# Patient Record
Sex: Male | Born: 1935 | Race: Black or African American | Hispanic: No | Smoking: Never smoker
Health system: Southern US, Community
[De-identification: ages and names within clinical notes are randomized; demographics above are authoritative.]

## PROBLEM LIST (undated history)

## (undated) DIAGNOSIS — H409 Unspecified glaucoma: Secondary | ICD-10-CM

## (undated) DIAGNOSIS — D649 Anemia, unspecified: Secondary | ICD-10-CM

## (undated) DIAGNOSIS — D472 Monoclonal gammopathy: Secondary | ICD-10-CM

## (undated) DIAGNOSIS — I10 Essential (primary) hypertension: Secondary | ICD-10-CM

## (undated) HISTORY — DX: Monoclonal gammopathy: D47.2

## (undated) HISTORY — PX: OTHER SURGICAL HISTORY: SHX169

## (undated) HISTORY — DX: Essential (primary) hypertension: I10

## (undated) HISTORY — DX: Anemia, unspecified: D64.9

## (undated) HISTORY — DX: Unspecified glaucoma: H40.9

---

## 2006-06-16 ENCOUNTER — Ambulatory Visit: Payer: Self-pay | Admitting: Cardiology

## 2006-06-21 ENCOUNTER — Ambulatory Visit (HOSPITAL_COMMUNITY): Admission: RE | Admit: 2006-06-21 | Discharge: 2006-06-21 | Payer: Self-pay | Admitting: Cardiology

## 2012-06-08 LAB — PROTIME-INR: INR: 1.7 — AB (ref 0.9–1.1)

## 2016-01-21 ENCOUNTER — Other Ambulatory Visit (HOSPITAL_COMMUNITY): Payer: Self-pay | Admitting: Internal Medicine

## 2016-01-21 DIAGNOSIS — C9030 Solitary plasmacytoma not having achieved remission: Secondary | ICD-10-CM

## 2016-01-21 DIAGNOSIS — D509 Iron deficiency anemia, unspecified: Secondary | ICD-10-CM | POA: Insufficient documentation

## 2016-01-21 DIAGNOSIS — D472 Monoclonal gammopathy: Secondary | ICD-10-CM | POA: Insufficient documentation

## 2016-01-30 ENCOUNTER — Ambulatory Visit (HOSPITAL_COMMUNITY)
Admission: RE | Admit: 2016-01-30 | Discharge: 2016-01-30 | Disposition: A | Discharge status: Home / Self Care | Payer: Medicare FFS | Source: Ambulatory Visit | Attending: Internal Medicine | Admitting: Internal Medicine

## 2016-01-30 DIAGNOSIS — N281 Cyst of kidney, acquired: Secondary | ICD-10-CM | POA: Diagnosis not present

## 2016-01-30 DIAGNOSIS — C9030 Solitary plasmacytoma not having achieved remission: Secondary | ICD-10-CM | POA: Diagnosis not present

## 2016-01-30 DIAGNOSIS — K7689 Other specified diseases of liver: Secondary | ICD-10-CM | POA: Diagnosis not present

## 2016-01-30 DIAGNOSIS — I7789 Other specified disorders of arteries and arterioles: Secondary | ICD-10-CM | POA: Diagnosis not present

## 2016-01-30 DIAGNOSIS — I289 Disease of pulmonary vessels, unspecified: Secondary | ICD-10-CM | POA: Insufficient documentation

## 2016-01-30 LAB — GLUCOSE, CAPILLARY: Glucose-Capillary: 77 mg/dL (ref 65–99)

## 2016-01-30 MED ORDER — FLUDEOXYGLUCOSE F - 18 (FDG) INJECTION
9.8000 | Freq: Once | INTRAVENOUS | Status: AC | PRN
  Administered 2016-01-30: 9.8 via INTRAVENOUS

## 2016-02-05 DIAGNOSIS — C9000 Multiple myeloma not having achieved remission: Secondary | ICD-10-CM | POA: Insufficient documentation

## 2016-02-05 DIAGNOSIS — D638 Anemia in other chronic diseases classified elsewhere: Secondary | ICD-10-CM | POA: Insufficient documentation

## 2016-02-05 DIAGNOSIS — Z7901 Long term (current) use of anticoagulants: Secondary | ICD-10-CM | POA: Insufficient documentation

## 2016-02-05 DIAGNOSIS — Z86718 Personal history of other venous thrombosis and embolism: Secondary | ICD-10-CM | POA: Insufficient documentation

## 2016-03-10 DIAGNOSIS — D510 Vitamin B12 deficiency anemia due to intrinsic factor deficiency: Secondary | ICD-10-CM | POA: Insufficient documentation

## 2016-04-08 ENCOUNTER — Other Ambulatory Visit (HOSPITAL_COMMUNITY): Payer: Self-pay | Admitting: Oncology

## 2016-06-29 ENCOUNTER — Encounter (HOSPITAL_COMMUNITY): Payer: Self-pay | Admitting: Adult Health

## 2016-06-29 ENCOUNTER — Encounter (HOSPITAL_COMMUNITY): Payer: Medicare FFS

## 2016-06-29 ENCOUNTER — Ambulatory Visit (HOSPITAL_COMMUNITY): Payer: Medicare FFS | Admitting: Hematology & Oncology

## 2016-06-29 ENCOUNTER — Encounter (HOSPITAL_COMMUNITY): Payer: Medicare FFS | Attending: Adult Health | Admitting: Adult Health

## 2016-06-29 VITALS — BP 153/70 | HR 58 | Temp 97.7°F | Resp 16 | Ht 70.25 in | Wt 193.8 lb

## 2016-06-29 DIAGNOSIS — D7589 Other specified diseases of blood and blood-forming organs: Secondary | ICD-10-CM | POA: Diagnosis not present

## 2016-06-29 DIAGNOSIS — H409 Unspecified glaucoma: Secondary | ICD-10-CM | POA: Diagnosis not present

## 2016-06-29 DIAGNOSIS — I82402 Acute embolism and thrombosis of unspecified deep veins of left lower extremity: Secondary | ICD-10-CM

## 2016-06-29 DIAGNOSIS — Z86718 Personal history of other venous thrombosis and embolism: Secondary | ICD-10-CM | POA: Insufficient documentation

## 2016-06-29 DIAGNOSIS — C9000 Multiple myeloma not having achieved remission: Secondary | ICD-10-CM | POA: Insufficient documentation

## 2016-06-29 DIAGNOSIS — D509 Iron deficiency anemia, unspecified: Secondary | ICD-10-CM | POA: Diagnosis not present

## 2016-06-29 DIAGNOSIS — D472 Monoclonal gammopathy: Secondary | ICD-10-CM

## 2016-06-29 DIAGNOSIS — Z7901 Long term (current) use of anticoagulants: Secondary | ICD-10-CM | POA: Diagnosis not present

## 2016-06-29 DIAGNOSIS — E538 Deficiency of other specified B group vitamins: Secondary | ICD-10-CM | POA: Diagnosis not present

## 2016-06-29 DIAGNOSIS — I10 Essential (primary) hypertension: Secondary | ICD-10-CM | POA: Insufficient documentation

## 2016-06-29 LAB — COMPREHENSIVE METABOLIC PANEL
ALBUMIN: 3.7 g/dL (ref 3.5–5.0)
ALK PHOS: 47 U/L (ref 38–126)
ALT: 12 U/L — AB (ref 17–63)
AST: 28 U/L (ref 15–41)
Anion gap: 5 (ref 5–15)
BUN: 19 mg/dL (ref 6–20)
CALCIUM: 9.1 mg/dL (ref 8.9–10.3)
CHLORIDE: 103 mmol/L (ref 101–111)
CO2: 26 mmol/L (ref 22–32)
CREATININE: 1.16 mg/dL (ref 0.61–1.24)
GFR calc Af Amer: >60 mL/min (ref >60)
GFR calc non Af Amer: 56 mL/min — ABNORMAL LOW (ref >60)
GLUCOSE: 85 mg/dL (ref 65–99)
Potassium: 3.7 mmol/L (ref 3.5–5.1)
SODIUM: 134 mmol/L — AB (ref 135–145)
Total Bilirubin: 0.4 mg/dL (ref 0.3–1.2)
Total Protein: 9.9 g/dL — ABNORMAL HIGH (ref 6.5–8.1)

## 2016-06-29 LAB — VITAMIN B12: Vitamin B-12: 346 pg/mL (ref 180–914)

## 2016-06-29 LAB — CBC WITH DIFFERENTIAL/PLATELET
BASOS ABS: 0 10*3/uL (ref 0.0–0.1)
Basophils Relative: 1 %
EOS ABS: 0.1 10*3/uL (ref 0.0–0.7)
Eosinophils Relative: 2 %
HCT: 38.3 % — ABNORMAL LOW (ref 39.0–52.0)
HEMOGLOBIN: 12.5 g/dL — AB (ref 13.0–17.0)
LYMPHS ABS: 1.7 10*3/uL (ref 0.7–4.0)
Lymphocytes Relative: 41 %
MCH: 31.9 pg (ref 26.0–34.0)
MCHC: 32.6 g/dL (ref 30.0–36.0)
MCV: 97.7 fL (ref 78.0–100.0)
Monocytes Absolute: 0.5 10*3/uL (ref 0.1–1.0)
Monocytes Relative: 12 %
NEUTROS PCT: 44 %
Neutro Abs: 1.9 10*3/uL (ref 1.7–7.7)
Platelets: 158 10*3/uL (ref 150–400)
RBC: 3.92 MIL/uL — AB (ref 4.22–5.81)
RDW: 15.2 % (ref 11.5–15.5)
WBC: 4.2 10*3/uL (ref 4.0–10.5)

## 2016-06-29 LAB — LACTATE DEHYDROGENASE: LDH: 195 U/L — ABNORMAL HIGH (ref 98–192)

## 2016-06-29 LAB — FOLATE: Folate: 21.7 ng/mL (ref >5.9)

## 2016-06-29 NOTE — Patient Instructions (Signed)
[place] at [facility] Discharge Instructions  RECOMMENDATIONS MADE BY THE CONSULTANT AND ANY TEST RESULTS WILL BE SENT TO YOUR REFERRING PHYSICIAN.  You saw Mike Craze, NP, today.  Thank you for choosing [place] at [facility] to provide your oncology and hematology care.  To afford each patient quality time with our provider, please arrive at least 15 minutes before your scheduled appointment time.   Beginning January 23rd 2017 lab work for the [facility] will be done in the  Main lab at [facility] on 1st floor. If you have a lab appointment with the [facility] please come in thru the  Main Entrance and check in at the main information desk  You need to re-schedule your appointment should you arrive 10 or more minutes late.  We strive to give you quality time with our providers, and arriving late affects you and other patients whose appointments are after yours.  Also, if you no show three or more times for appointments you may be dismissed from the clinic at the providers discretion.     Again, thank you for choosing [facility].  Our hope is that these requests will decrease the amount of time that you wait before being seen by our physicians.       _____________________________________________________________  Should you have questions after your visit to [facility], please contact our office at [phone] between the hours of 8:30 a.m. and 4:30 p.m.  Voicemails left after 4:30 p.m. will not be returned until the following business day.  For prescription refill requests, have your pharmacy contact our office.         Resources For Cancer Patients and their Caregivers ? American Cancer Society: Can assist with transportation, wigs, general needs, runs Look Good Feel Better.        949-394-8860 ? Cancer Care: Provides financial assistance, online support groups, medication/co-pay  assistance.  1-800-813-HOPE 386-029-6129) ? [facility] Assists [place] cancer patients and their families through emotional , educational and financial support.  (313)774-1615 ? [place] DSS Where to apply for food stamps, Medicaid and utility assistance. 405 413 0049 ? [facility]: Transportation to medical appointments. 478-550-5688 ? Social Security Administration: May apply for disability if have a Stage IV cancer. 780 206 4003 3478033967 ? [employer], Disability and Transit Services: Assists with nutrition, care and transit needs. [place] Support Programs: @10RELATIVEDAYS @ > Cancer Support Group  2nd Tuesday of the month 1pm-2pm, Journey Room  > Creative Journey  3rd Tuesday of the month 1130am-1pm, Journey Room  > Look Good Feel Better  1st Wednesday of the month 10am-12 noon, Journey Room (Call [facility] to register [phone])

## 2016-07-05 LAB — MULTIPLE MYELOMA PANEL, SERUM
ALBUMIN/GLOB SERPL: 0.7 (ref 0.7–1.7)
Albumin SerPl Elph-Mcnc: 4 g/dL (ref 2.9–4.4)
Alpha 1: 0.2 g/dL (ref 0.0–0.4)
Alpha2 Glob SerPl Elph-Mcnc: 0.6 g/dL (ref 0.4–1.0)
B-Globulin SerPl Elph-Mcnc: 1 g/dL (ref 0.7–1.3)
Gamma Glob SerPl Elph-Mcnc: 4.2 g/dL — ABNORMAL HIGH (ref 0.4–1.8)
Globulin, Total: 6 g/dL — ABNORMAL HIGH (ref 2.2–3.9)
IGG (IMMUNOGLOBIN G), SERUM: 4872 mg/dL — AB (ref 700–1600)
IgA: <5 mg/dL — ABNORMAL LOW (ref 61–437)
IgM, Serum: 17 mg/dL (ref 15–143)
M PROTEIN SERPL ELPH-MCNC: 3.8 g/dL — AB
TOTAL PROTEIN ELP: 10 g/dL — AB (ref 6.0–8.5)

## 2016-07-06 NOTE — [place] Notes (Signed)
[name] [address] [place] [address]   CLINIC:  Medical Oncology  REASON FOR CONSULTATION:  Smoldering multiple myeloma Iron-deficiency anemia   REFERRAL FROM:  [facility]  Dr. Audree Camel   PRIMARY CARE PROVIDER: Dr. Stoney Bang [phone]   BRIEF ONCOLOGY HISTORY:  Multiple Myeloma-Patient was referred to Dr. Jacquiline Doe, further work-up suggested malignancy as possible etiology. In 12/2015-patient underwent bone marrow biopsy showing 13% myeloma cells, hypercellular bone marrow 40%, FISH IGH/14q+, and gain of Ch9 & CCNDI/11q; Amyloid stains (-). No bone pain, muscle weakness.  No treatment recommended; has been under surveillance since that time.  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   Left LE DVT-Diagnosed about 7 years ago; on anti-coagulation with Coumadin 5 mg daily; managed by PCP.    INTERVAL HISTORY:  Dale Suarez presents to the [facility] to establish care after being referred by Dr. Jacquiline Doe at [facility]  He tells me that he was referred to see Dr. Jacquiline Doe back in 12/2015, who recommended he undergo bone marrow biopsy on suspicion of plasma cell disease.  He was found to have multiple myeloma, but no active treatment was recommended at that time "because it wasn't too bad that I needed any treatment."  He subsequently underwent PET scan evaluation "and he told me that everything looked good on that scan."    He denies any bone pain or weakness.  His energy levels are good; he remains very active by walking about 0.5 miles per day.  He lives alone and is able to care for himself independently.    About 7 years ago, he was diagnosed with a DVT in his left leg.  He is on daily Coumadin for this and it is well-managed by his PCP.  He tells me that he will occasionally have hematuria "and  that's when I know my blood is too thin so I call my doctor."  He has had no episodes of hematuria in quite some time.    His weight has been stable.  His appetite is good. He has some constipation from time-to-time, but "I just take me a few extra prunes and it gets better."  He denies any blood in his stools or dark, tarry stools.  Overall, he feels great.    He wanted me to make note that his only daughter, Dale Suarez, is his Press photographer and is a Architect and works as the Personal assistant on the mother/baby unit at [employer]: Review of Systems  Constitutional: Negative.   HENT: Negative.   Eyes: Negative.   Respiratory: Negative.   Cardiovascular: Negative.  Negative for leg swelling.  Gastrointestinal: Positive for constipation. Negative for blood in stool and melena.  Genitourinary: Negative.  Negative for hematuria.  Musculoskeletal: Negative.  Negative for back pain and joint pain.  Skin: Negative.   Neurological: Negative.   Endo/Heme/Allergies: Bruises/bleeds easily.       Easy bruising due to Coumadin   Psychiatric/Behavioral: Negative.     PAST MEDICAL & SURGICAL HISTORY:  Past Medical History:  Diagnosis Date  . Anemia   . Glaucoma   . Hypertension    History reviewed. No pertinent surgical history.   SOCIAL HISTORY:  [name] is widowed and lives alone in [place], [place].  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 He is now retired.  He has one daughter, Dale Suarez, who is his Press photographer and is a Architect and works as the Personal assistant on the mother/baby unit at [employer]. He has 4 grandsons, ranging in ages from 21-35.  He enjoys watching TV,  particularly old westerns.  He also enjoys walking, which he does every day for about 0.5 mile/day.    CURRENT MEDICATIONS:  No current outpatient prescriptions on file prior to visit.   No current facility-administered medications on file prior to visit.      ALLERGIES: No Known Allergies   PERFORMANCE STATUS: 90   PHYSICAL EXAM:  Vitals:   06/29/16 1446  BP: (!) 153/70  Pulse: (!) 58  Resp: 16  Temp: 97.7 F (36.5 C)   Filed Weights   06/29/16 1446  Weight: 193 lb 12.8 oz (87.9 kg)    General: Male in no acute distress.  Unaccompanied today.    HEENT: Head is normocephalic.  Pupils equal and reactive to light. Conjunctivae clear without exudate.  Sclerae anicteric. Oral mucosa is pink and moist without lesions. Oropharynx is pink and moist without lesions. Lymph: No cervical, supraclavicular, infraclavicular, or axillary lymphadenopathy noted on palpation.   Cardiovascular: Normal rate and rhythm Respiratory: Clear to auscultation bilaterally. Chest expansion symmetric without accessory muscle use. Breathing non-labored.   Back: No focal tenderness to spine on percussion.  Small, round mobile cyst to right upper back/scapula region (benign); non-tender; no erythema or pain.    GU: Deferred.   GI: Soft, non-tender abdomen. Normoactive bowel sounds. No hepatosplenomegaly. Neuro: No focal deficits. Steady gait.   Psych: Normal mood and affect for situation. Extremities: No edema.   Skin: Warm and dry.    LABORATORY DATA: Recent labs from PCP's office dated 06/03/16    CBC    Component Value Date/Time   WBC 4.2 06/29/2016 1619   RBC 3.92 (L) 06/29/2016 1619   HGB 12.5 (L) 06/29/2016 1619   HCT 38.3 (L) 06/29/2016 1619   PLT 158 06/29/2016 1619   MCV 97.7 06/29/2016 1619   MCH 31.9 06/29/2016 1619   MCHC 32.6 06/29/2016 1619   RDW 15.2 06/29/2016 1619   LYMPHSABS 1.7 06/29/2016 1619   MONOABS 0.5 06/29/2016 1619   EOSABS 0.1 06/29/2016 1619   BASOSABS 0.0  06/29/2016 1619   CMP Latest Ref Rng & Units 06/29/2016  Glucose 65 - 99 mg/dL 85  BUN 6 - 20 mg/dL 19  Creatinine 0.61 - 1.24 mg/dL 1.16  Sodium 135 - 145 mmol/L 134(L)  Potassium 3.5 - 5.1 mmol/L 3.7  Chloride 101 - 111 mmol/L 103  CO2 22 - 32 mmol/L 26  Calcium 8.9 - 10.3 mg/dL 9.1  Total Protein 6.5 - 8.1 g/dL 9.9(H)  Total Bilirubin 0.3 - 1.2 mg/dL 0.4  Alkaline Phos 38 - 126 U/L 47  AST 15 - 41 U/L 28  ALT 17 - 63 U/L 12(L)   Results for JOSHUAL, TERRIO (MRN [id number])   Ref. Range 06/29/2016 16:19  LDH Latest Ref Range: 98 - 192 U/L 195 (H)   Results for GURFATEH, MCCLAIN (MRN [id number])   Ref. Range 06/29/2016 16:19  Folate Latest Ref Range: >5.9 ng/mL 21.7  Vitamin B12 Latest Ref Range: 180 - 914  pg/mL 346     *Iron studies pending  DIAGNOSTIC IMAGING:  NM PET Image Initial (PI) Skull Base To Thigh (Accession [id number]) (Order [id number])   Show images for NM PET Image Initial (PI) Skull Base To Thigh  Study Result   CLINICAL DATA:  Initial treatment strategy for plasmacytoma.  EXAM: NUCLEAR MEDICINE PET SKULL BASE TO THIGH  TECHNIQUE: 9.8 mCi F-18 FDG was injected intravenously. Full-ring PET imaging was performed from the skull base to thigh after the radiotracer. CT data was obtained and used for attenuation correction and anatomic localization.  FASTING BLOOD GLUCOSE:  Value: 77 mg/dl  COMPARISON:  Skeletal survey from 12/25/2015.  FINDINGS: NECK  No areas of abnormal hypermetabolism.  CHEST  No areas of abnormal hypermetabolism.  ABDOMEN/PELVIS  No abnormal nodal hypermetabolism within the abdomen or pelvis. Left adrenal hypermetabolism is without well-defined CT correlate. Measures on the order of a S.U.V. max of 4.1.  SKELETON  No abnormal marrow activity.  CT IMAGES PERFORMED FOR ATTENUATION CORRECTION  No cervical adenopathy. Aortic and branch vessel atherosclerosis. 3 cm right pulmonary artery. Well-circumscribed  low-density hepatic lesions which are likely cysts. An interpolar are left renal 3.4 cm lesion is fluid density and also likely a cyst. Mild prostatomegaly. Right larger than left hydroceles.  IMPRESSION: 1. No evidence of hypermetabolic osseous or soft tissue myeloma. 2. Left adrenal hypermetabolism is favored to be physiologic. 3. Hepatic and left renal cysts. 4. Enlarged right pulmonary artery, likely related to history of large volume right-sided pulmonary embolism (see 06/16/06 chest CT).   Electronically Signed   By: Abigail Miyamoto M.D.   On: 01/30/2016 14:41     PET scan (outside records) showed:  1. Left adrenal hypermetabolism without well-defined CT correlate; felt to be physiologic. 2. No evidence of bone metastatic process.      ASSESSMENT & PLAN:  Mr. Strohm is a pleasant 82 y.o. male with recent diagnosis of plasma cell dyscrasia, with no prior treatment and currently on observation alone; diagnosed in 12/2015 at [facility] in [place], [place].  Patient presents to [facility] today to establish care and for continued follow-up.   1. Plasma cell dyscrasia/Smoldering multiple myeloma: Mr. Kobus is clinically stable without any side effects of the multiple myeloma. He denies any bone pain.  Basic labs were collected today including CBC, CMET and LDH. He is not hypercalcemic; his creatinine is normal.  His PET scan completed at outside facility is reassuring for no evidence of bony metastases.  We will also collect multiple myeloma panel to further evaluate his disease.  He will continue to follow-up with Dr. Whitney Muse every 3 months per surveillance guidelines.    2. Iron-deficiency anemia: Mr. Branch had 2 IV iron infusions (Injectafer) over 2 weeks back in 01/2016.  There are no records available for review of iron studies either before or after his IV iron. We will collect iron studies today to further evaluate his anemia.  He continues to  decline GI work-up today, citing "I don't want my insurance company to have to pay for any extra tests."    3. History of DVT, on anticoagulation: Continue Coumadin and follow-up as recommended by PCP.     Dispo:  -Return to cancer center to see Dr. Whitney Muse in 3 months with labs preceding that visit.   -This patient was seen & evaluated by Dr. Ancil Linsey who agrees with the above plan of care.   A total of 60 minutes was spent  in face-to-face care of this patient, with greater than 50% of that time spent in counseling and care coordination.    Mike Craze, NP [place] [phone]    Patient was seen and examined with Mike Craze, NP. This is a shared visit. I agree with her HPI. PE is documented below:   PHYSICAL EXAM:  Vitals:   06/29/16 1446  BP: (!) 153/70  Pulse: (!) 58  Resp: 16  Temp: 97.7 F (36.5 C)   Filed Weights   06/29/16 1446  Weight: 193 lb 12.8 oz (87.9 kg)    General: Male in no acute distress.  Unaccompanied today.    HEENT: Head is normocephalic.  Pupils equal and reactive to light. Conjunctivae clear without exudate.  Sclerae anicteric. Oral mucosa is pink and moist without lesions. Oropharynx is pink and moist without lesions. Lymph: No cervical, supraclavicular, infraclavicular, or axillary lymphadenopathy noted on palpation.   Cardiovascular: Normal rate and rhythm S1/S2 audible and regular Respiratory: Clear to auscultation bilaterally. No wheezing or rhonchi Back: No focal tenderness to spine on percussion.  Small, round mobile cyst to right upper back/scapula region (benign); non-tender; no erythema or pain.    GU: Deferred.   GI: Soft, non-tender abdomen. Normoactive bowel sounds. No hepatosplenomegaly. Neuro: No focal deficits. Steady gait.   Psych: Normal mood and affect for situation. Extremities: No edema.   Skin: Warm and dry.   ASSESSMENT/PLAN: Smoldering Myeloma Iron deficiency Anemia Chronic  anticoagulation Hemoccult positive stool Iron deficiency History LLE DVT  Labs are reviewed and stable, PET/CT was performed at [facility] in April and showed no evidence of lytic bone disease or soft tissue involvement. Would continue with coumadin as it is controversial whether patients with smoldering myeloma/MGUS are more prone to DVT. In addition will continue to follow labs in accordance with guidelines. Will follow iron levels as well. Given hemoccult positive stool, ongoing anticoagulation and iron deficiency will also continue to address GI evaluation moving forward.  RTC in 3 months. Sooner if needed. Donald Pore MD

## 2016-07-19 ENCOUNTER — Encounter (HOSPITAL_COMMUNITY): Payer: Self-pay | Admitting: Adult Health

## 2016-09-30 ENCOUNTER — Encounter (HOSPITAL_COMMUNITY): Payer: Medicare FFS

## 2016-09-30 ENCOUNTER — Encounter (HOSPITAL_COMMUNITY): Payer: Medicare FFS | Attending: Adult Health | Admitting: Hematology & Oncology

## 2016-09-30 ENCOUNTER — Encounter (HOSPITAL_COMMUNITY): Payer: Self-pay | Admitting: Hematology & Oncology

## 2016-09-30 VITALS — BP 149/58 | HR 64 | Temp 99.0°F | Resp 16 | Wt 196.6 lb

## 2016-09-30 DIAGNOSIS — I10 Essential (primary) hypertension: Secondary | ICD-10-CM | POA: Insufficient documentation

## 2016-09-30 DIAGNOSIS — E538 Deficiency of other specified B group vitamins: Secondary | ICD-10-CM

## 2016-09-30 DIAGNOSIS — D509 Iron deficiency anemia, unspecified: Secondary | ICD-10-CM

## 2016-09-30 DIAGNOSIS — D7589 Other specified diseases of blood and blood-forming organs: Secondary | ICD-10-CM | POA: Diagnosis not present

## 2016-09-30 DIAGNOSIS — C9000 Multiple myeloma not having achieved remission: Secondary | ICD-10-CM | POA: Diagnosis not present

## 2016-09-30 DIAGNOSIS — D472 Monoclonal gammopathy: Secondary | ICD-10-CM

## 2016-09-30 DIAGNOSIS — Z7901 Long term (current) use of anticoagulants: Secondary | ICD-10-CM | POA: Diagnosis not present

## 2016-09-30 DIAGNOSIS — Z86718 Personal history of other venous thrombosis and embolism: Secondary | ICD-10-CM | POA: Diagnosis not present

## 2016-09-30 DIAGNOSIS — H409 Unspecified glaucoma: Secondary | ICD-10-CM | POA: Insufficient documentation

## 2016-09-30 LAB — COMPREHENSIVE METABOLIC PANEL
ALT: 14 U/L — ABNORMAL LOW (ref 17–63)
AST: 33 U/L (ref 15–41)
Albumin: 3.2 g/dL — ABNORMAL LOW (ref 3.5–5.0)
Alkaline Phosphatase: 50 U/L (ref 38–126)
Anion gap: 3 — ABNORMAL LOW (ref 5–15)
BUN: 16 mg/dL (ref 6–20)
CO2: 27 mmol/L (ref 22–32)
Calcium: 8.5 mg/dL — ABNORMAL LOW (ref 8.9–10.3)
Chloride: 101 mmol/L (ref 101–111)
Creatinine, Ser: 1.31 mg/dL — ABNORMAL HIGH (ref 0.61–1.24)
GFR calc Af Amer: 56 mL/min — ABNORMAL LOW (ref >60)
GFR calc non Af Amer: 49 mL/min — ABNORMAL LOW (ref >60)
Glucose, Bld: 82 mg/dL (ref 65–99)
Potassium: 4 mmol/L (ref 3.5–5.1)
Sodium: 131 mmol/L — ABNORMAL LOW (ref 135–145)
Total Bilirubin: 0.5 mg/dL (ref 0.3–1.2)
Total Protein: 9.7 g/dL — ABNORMAL HIGH (ref 6.5–8.1)

## 2016-09-30 LAB — IRON AND TIBC
Iron: 45 ug/dL (ref 45–182)
Saturation Ratios: 13 % — ABNORMAL LOW (ref 17.9–39.5)
TIBC: 336 ug/dL (ref 250–450)
UIBC: 291 ug/dL

## 2016-09-30 LAB — FOLATE: Folate: 23.9 ng/mL (ref >5.9)

## 2016-09-30 LAB — CBC WITH DIFFERENTIAL/PLATELET
BASOS ABS: 0 10*3/uL (ref 0.0–0.1)
BASOS PCT: 0 %
EOS ABS: 0 10*3/uL (ref 0.0–0.7)
Eosinophils Relative: 1 %
HCT: 37 % — ABNORMAL LOW (ref 39.0–52.0)
HEMOGLOBIN: 11.8 g/dL — AB (ref 13.0–17.0)
Lymphocytes Relative: 40 %
Lymphs Abs: 1.2 10*3/uL (ref 0.7–4.0)
MCH: 30.9 pg (ref 26.0–34.0)
MCHC: 31.9 g/dL (ref 30.0–36.0)
MCV: 96.9 fL (ref 78.0–100.0)
MONOS PCT: 17 %
Monocytes Absolute: 0.5 10*3/uL (ref 0.1–1.0)
NEUTROS ABS: 1.3 10*3/uL — AB (ref 1.7–7.7)
NEUTROS PCT: 42 %
Platelets: 150 10*3/uL (ref 150–400)
RBC: 3.82 MIL/uL — ABNORMAL LOW (ref 4.22–5.81)
RDW: 15.5 % (ref 11.5–15.5)
WBC: 3 10*3/uL — AB (ref 4.0–10.5)

## 2016-09-30 LAB — LACTATE DEHYDROGENASE: LDH: 214 U/L — ABNORMAL HIGH (ref 98–192)

## 2016-09-30 LAB — VITAMIN B12: Vitamin B-12: 306 pg/mL (ref 180–914)

## 2016-09-30 LAB — FERRITIN: Ferritin: 27 ng/mL (ref 24–336)

## 2016-09-30 NOTE — Progress Notes (Signed)
[place] [address] [place] [address]   CLINIC:  Medical Oncology  REASON FOR CONSULTATION:  Smoldering multiple myeloma Iron-deficiency anemia   REFERRAL FROM:  [facility]  Dr. Audree Camel   PRIMARY CARE PROVIDER: Dr. Stoney Bang [phone]   BRIEF ONCOLOGY HISTORY:  Multiple Myeloma-Patient was referred to Dr. Jacquiline Doe, further work-up suggested malignancy as possible etiology. In 12/2015-patient underwent bone marrow biopsy showing 13% myeloma cells, hypercellular bone marrow 40%, FISH IGH/14q+, and gain of Ch9 & CCNDI/11q; Amyloid stains (-). No bone pain, muscle weakness.  No treatment recommended; has been under surveillance since that time.  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   Left LE DVT-Diagnosed about 7 years ago; on anti-coagulation with Coumadin 5 mg daily; managed by PCP.    INTERVAL HISTORY:  Dale Suarez is an 82 year old male with a medical history significant for smoldering multiple myeloma, anemia, and left LE DVT and is here for a follow-up. He transferred here from [facility]. He was last seen at [facility] on 06/29/2016.  Patient has a cold and has been taking Mucinex, Clairitin, and cough medicine. He feels well otherwise. Denies poor appetite or abdominal pain.  [place] noticed a lump on his right shoulder a few days ago. It is not painful and it is not increasing in size.  Patient reports pulling a muscle in his leg 2-3 weeks ago. Soreness lasted for a week, but has since subsided. He is fairly active.   He recently received vitamin B12 injection when he visited his PCP. He currently takes vitamin B12 supplements.   Altariq has already received the flu shot this year. He denies any new pain. Appetite is unchanged.   ADDITIONAL REVIEW OF SYSTEMS: Review of Systems  Constitutional: Negative.   HENT: Negative.   Eyes:  Negative.   Respiratory: Negative.   Cardiovascular: Negative.  Negative for leg swelling.  Gastrointestinal: Negative for blood in stool and melena.  Genitourinary: Negative.  Negative for hematuria.  Musculoskeletal: Negative.  Negative for back pain and joint pain.  Skin: Negative.        Mass on right shoulder  Neurological: Negative.   Endo/Heme/Allergies: Bruises/bleeds easily.       Easy bruising due to Coumadin   Psychiatric/Behavioral: Negative.     PAST MEDICAL & SURGICAL HISTORY:  Past Medical History:  Diagnosis Date  . Anemia   . Glaucoma   . Hypertension    History reviewed. No pertinent surgical history.   SOCIAL HISTORY:  Dale Suarez is widowed and lives alone in [place], [place].  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 He is now retired.  He has one daughter, [name], who is his Press photographer and is a Architect and works as the Personal assistant on the mother/baby unit at [employer]. He has 4 grandsons, ranging in ages from 21-35.  He enjoys watching TV, particularly old westerns.  He also enjoys walking, which he does every day for about 0.5 mile/day.    CURRENT MEDICATIONS:  Current Outpatient Prescriptions on File Prior to Visit  Medication Sig Dispense Refill  . albuterol (PROVENTIL HFA;VENTOLIN HFA) 108 (90 Base) MCG/ACT inhaler Inhale  into the lungs.    . brinzolamide (AZOPT) 1 % ophthalmic suspension Place 1 drop into both eyes 2 (two) times daily.    . Cholecalciferol (VITAMIN D3) 2000 units TABS Take by mouth.    . Cod Liver Oil OIL Take by mouth.    . doxazosin (CARDURA) 8 MG tablet Take by mouth.    . Glucosamine Sulfate 1000 MG CAPS Take by mouth.    . hydrochlorothiazide (HYDRODIURIL) 50 MG tablet Take by mouth.    .  latanoprost (XALATAN) 0.005 % ophthalmic solution Place 1 drop into both eyes at bedtime.    Marland Kitchen loratadine (CLARITIN) 10 MG tablet Take by mouth.    . warfarin (COUMADIN) 5 MG tablet Take by mouth.     No current facility-administered medications on file prior to visit.      ALLERGIES: No Known Allergies   PERFORMANCE STATUS: 90   PHYSICAL EXAM:  Vitals:   09/30/16 1320  BP: (!) 149/58  Pulse: 64  Resp: 16  Temp: 99 F (37.2 C)   Filed Weights   09/30/16 1320  Weight: 196 lb 9.6 oz (89.2 kg)    General: Male in no acute distress.  Unaccompanied today.    HEENT: Head is normocephalic.  Pupils equal and reactive to light. Conjunctivae clear without exudate.  Sclerae anicteric. Oral mucosa is pink and moist without lesions. Oropharynx is pink and moist without lesions. Lymph: No cervical, supraclavicular, infraclavicular, or axillary lymphadenopathy noted on palpation.   Cardiovascular: Normal rate and rhythm Respiratory: Clear to auscultation bilaterally. Chest expansion symmetric without accessory muscle use. Breathing non-labored.   Back: No focal tenderness to spine on percussion.  Small, round mobile cyst to right upper back/scapula region (benign); non-tender; no erythema or pain.    GU: Deferred.   GI: Soft, non-tender abdomen. Normoactive bowel sounds. No hepatosplenomegaly. Neuro: No focal deficits. Steady gait.   Psych: Normal mood and affect for situation. Extremities: No edema.  1.5 cm mass on right shoulder. Skin: Warm and dry.    LABORATORY DATA: Recent labs from PCP's office dated 06/03/16    CBC    Component Value Date/Time   WBC 3.0 (L) 09/30/2016 1208   RBC 3.82 (L) 09/30/2016 1208   HGB 11.8 (L) 09/30/2016 1208   HCT 37.0 (L) 09/30/2016 1208   PLT 150 09/30/2016 1208   MCV 96.9 09/30/2016 1208   MCH 30.9 09/30/2016 1208   MCHC 31.9 09/30/2016 1208   RDW 15.5 09/30/2016 1208   LYMPHSABS 1.2 09/30/2016 1208   MONOABS 0.5 09/30/2016 1208    EOSABS 0.0 09/30/2016 1208   BASOSABS 0.0 09/30/2016 1208   CMP Latest Ref Rng & Units 09/30/2016 06/29/2016  Glucose 65 - 99 mg/dL 82 85  BUN 6 - 20 mg/dL 16 19  Creatinine 0.61 - 1.24 mg/dL 1.31(H) 1.16  Sodium 135 - 145 mmol/L 131(L) 134(L)  Potassium 3.5 - 5.1 mmol/L 4.0 3.7  Chloride 101 - 111 mmol/L 101 103  CO2 22 - 32 mmol/L 27 26  Calcium 8.9 - 10.3 mg/dL 8.5(L) 9.1  Total Protein 6.5 - 8.1 g/dL 9.7(H) 9.9(H)  Total Bilirubin 0.3 - 1.2 mg/dL 0.5 0.4  Alkaline Phos 38 - 126 U/L 50 47  AST 15 - 41 U/L 33 28  ALT 17 - 63 U/L 14(L) 12(L)   Results for CHILD, CAMPOY (MRN [id number])   Ref. Range 06/29/2016 16:19  LDH Latest Ref Range: 98 - 192 U/L 195 (H)   Results for Bucklew, Zekiel  Loletha Grayer (MRN [id number])   Ref. Range 06/29/2016 16:19  Folate Latest Ref Range: >5.9 ng/mL 21.7  Vitamin B12 Latest Ref Range: 180 - 914 pg/mL 346      Results for TAESHAUN, RAMES (MRN [id number])   Ref. Range 09/30/2016 12:08  Iron Latest Ref Range: 45 - 182 ug/dL 45  UIBC Latest Units: ug/dL 291  TIBC Latest Ref Range: 250 - 450 ug/dL 336  Saturation Ratios Latest Ref Range: 17.9 - 39.5 % 13 (L)  Ferritin Latest Ref Range: 24 - 336 ng/mL 27    DIAGNOSTIC IMAGING:  NM PET Image Initial (PI) Skull Base To Thigh (Accession [id number]) (Order [id number])   Show images for NM PET Image Initial (PI) Skull Base To Thigh  Study Result   CLINICAL DATA:  Initial treatment strategy for plasmacytoma.  EXAM: NUCLEAR MEDICINE PET SKULL BASE TO THIGH  TECHNIQUE: 9.8 mCi F-18 FDG was injected intravenously. Full-ring PET imaging was performed from the skull base to thigh after the radiotracer. CT data was obtained and used for attenuation correction and anatomic localization.  FASTING BLOOD GLUCOSE:  Value: 77 mg/dl  COMPARISON:  Skeletal survey from 12/25/2015.  FINDINGS: NECK  No areas of abnormal hypermetabolism.  CHEST  No areas of abnormal  hypermetabolism.  ABDOMEN/PELVIS  No abnormal nodal hypermetabolism within the abdomen or pelvis. Left adrenal hypermetabolism is without well-defined CT correlate. Measures on the order of a S.U.V. max of 4.1.  SKELETON  No abnormal marrow activity.  CT IMAGES PERFORMED FOR ATTENUATION CORRECTION  No cervical adenopathy. Aortic and branch vessel atherosclerosis. 3 cm right pulmonary artery. Well-circumscribed low-density hepatic lesions which are likely cysts. An interpolar are left renal 3.4 cm lesion is fluid density and also likely a cyst. Mild prostatomegaly. Right larger than left hydroceles.  IMPRESSION: 1. No evidence of hypermetabolic osseous or soft tissue myeloma. 2. Left adrenal hypermetabolism is favored to be physiologic. 3. Hepatic and left renal cysts. 4. Enlarged right pulmonary artery, likely related to history of large volume right-sided pulmonary embolism (see 06/16/06 chest CT).   Electronically Signed   By: Abigail Miyamoto M.D.   On: 01/30/2016 14:41     PET scan (outside records) showed:  1. Left adrenal hypermetabolism without well-defined CT correlate; felt to be physiologic. 2. No evidence of bone metastatic process.      ASSESSMENT & PLAN:  Mr. Dearmas is a pleasant 82 y.o. male with recent diagnosis of plasma cell dyscrasia, with no prior treatment and currently on observation alone; diagnosed in 12/2015 at [facility] in [place], [place].  Patient presents to [facility] today for continued follow-up.   1. Plasma cell dyscrasia/Smoldering multiple myeloma: Mr. Lortie is clinically stable without any side effects of the multiple myeloma. He denies any bone pain.  Will repeat PET/CT after the New Year. Current M spike is 3.8 gm /dl and given plasma cell % in his marrow he warrant close observation. Continue with 3 month f/u with labs.   2. Iron-deficiency anemia: Mr. Amrhein had 2 IV iron infusions (Injectafer)  over 2 weeks back in 01/2016.   He continues to decline GI work-up today, citing "I don't want my insurance company to have to pay for any extra tests."  I have arranged for another dose of IV iron. I emphasized the importance of GI evaluation.   3. History of DVT, on anticoagulation: Continue Coumadin and follow-up as recommended by PCP.     Orders Placed This Encounter  Procedures  .  NM PET Image Restage (PS) Whole Body    Standing Status:   Future    Number of Occurrences:   1    Standing Expiration Date:   09/30/2017    Order Specific Question:   Reason for Exam (SYMPTOM  OR DIAGNOSIS REQUIRED)    Answer:   smoldering myeloma    Order Specific Question:   Preferred imaging location?    Answer:   [facility]    Order Specific Question:   If indicated for the ordered procedure, I authorize the administration of a radiopharmaceutical per Radiology protocol    Answer:   Yes  . Kappa/lambda light chains    Standing Status:   Future    Number of Occurrences:   1    Standing Expiration Date:   10/21/2017  . Immunofixation electrophoresis    Standing Status:   Future    Number of Occurrences:   1    Standing Expiration Date:   10/21/2017  . Protein electrophoresis, serum    Standing Status:   Future    Number of Occurrences:   1    Standing Expiration Date:   10/21/2017   This document serves as a record of services personally performed by Ancil Linsey, MD. It was created on her behalf by Elmyra Ricks, a trained medical scribe. The creation of this record is based on the scribe's personal observations and the provider's statements to them. This document has been checked and approved by the attending provider.  I have reviewed the above documentation for accuracy and completeness and I agree with the above.  Kelby Fam. Whitney Muse, MD

## 2016-09-30 NOTE — Patient Instructions (Addendum)
[place] at [facility] Discharge Instructions  RECOMMENDATIONS MADE BY THE CONSULTANT AND ANY TEST RESULTS WILL BE SENT TO YOUR REFERRING PHYSICIAN.  You saw Dr.Penland today. Will schedule a PET scan to look at your shoulder. Return to clinic after you have had the scan for follow up and labs. We will call you with your iron results. See Amy at checkout for appointments.  Thank you for choosing [place] at [facility] to provide your oncology and hematology care.  To afford each patient quality time with our provider, please arrive at least 15 minutes before your scheduled appointment time.   Beginning January 23rd 2017 lab work for the [facility] will be done in the  Main lab at [facility] on 1st floor. If you have a lab appointment with the [facility] please come in thru the  Main Entrance and check in at the main information desk  You need to re-schedule your appointment should you arrive 10 or more minutes late.  We strive to give you quality time with our providers, and arriving late affects you and other patients whose appointments are after yours.  Also, if you no show three or more times for appointments you may be dismissed from the clinic at the providers discretion.     Again, thank you for choosing [facility].  Our hope is that these requests will decrease the amount of time that you wait before being seen by our physicians.       _____________________________________________________________  Should you have questions after your visit to [facility], please contact our office at [phone] between the hours of 8:30 a.m. and 4:30 p.m.  Voicemails left after 4:30 p.m. will not be returned until the following business day.  For prescription refill requests, have your pharmacy contact our office.         Resources For Cancer Patients and their Caregivers ? American Cancer Society: Can  assist with transportation, wigs, general needs, runs Look Good Feel Better.        [phone] ? Cancer Care: Provides financial assistance, online support groups, medication/co-pay assistance.  1-800-813-HOPE 425 883 6414) ? [place] Assists [place] cancer patients and their families through emotional , educational and financial support.  458-257-0146 ? [place] DSS Where to apply for food stamps, Medicaid and utility assistance. (864) 563-0402 ? RCATS: Transportation to medical appointments. (805)334-8188 ? Social Security Administration: May apply for disability if have a Stage IV cancer. 907-558-9019 (505) 500-8101 ? [employer], Disability and Transit Services: Assists with nutrition, care and transit needs. [place] Support Programs: @10RELATIVEDAYS @ > Cancer Support Group  2nd Tuesday of the month 1pm-2pm, Journey Room  > Creative Journey  3rd Tuesday of the month 1130am-1pm, Journey Room  > Look Good Feel Better  1st Wednesday of the month 10am-12 noon, Journey Room (Call [place] to register [phone])

## 2016-10-12 ENCOUNTER — Encounter (HOSPITAL_COMMUNITY): Payer: Medicare FFS

## 2016-10-14 ENCOUNTER — Other Ambulatory Visit (HOSPITAL_COMMUNITY): Payer: Medicare FFS

## 2016-10-14 ENCOUNTER — Ambulatory Visit (HOSPITAL_COMMUNITY): Payer: Medicare FFS | Admitting: Hematology & Oncology

## 2016-10-29 ENCOUNTER — Ambulatory Visit (HOSPITAL_COMMUNITY)
Admission: RE | Admit: 2016-10-29 | Discharge: 2016-10-29 | Disposition: A | Discharge status: Home / Self Care | Payer: Medicare FFS | Source: Ambulatory Visit | Attending: Hematology & Oncology | Admitting: Hematology & Oncology

## 2016-10-29 ENCOUNTER — Other Ambulatory Visit (HOSPITAL_COMMUNITY): Payer: Self-pay | Admitting: *Deleted

## 2016-10-29 DIAGNOSIS — C9000 Multiple myeloma not having achieved remission: Secondary | ICD-10-CM

## 2016-10-29 DIAGNOSIS — N400 Enlarged prostate without lower urinary tract symptoms: Secondary | ICD-10-CM | POA: Diagnosis not present

## 2016-10-29 DIAGNOSIS — I700 Atherosclerosis of aorta: Secondary | ICD-10-CM | POA: Diagnosis not present

## 2016-10-29 DIAGNOSIS — D509 Iron deficiency anemia, unspecified: Secondary | ICD-10-CM

## 2016-10-29 DIAGNOSIS — D510 Vitamin B12 deficiency anemia due to intrinsic factor deficiency: Secondary | ICD-10-CM

## 2016-10-29 LAB — GLUCOSE, CAPILLARY: GLUCOSE-CAPILLARY: 92 mg/dL (ref 65–99)

## 2016-10-29 MED ORDER — FLUDEOXYGLUCOSE F - 18 (FDG) INJECTION
9.8200 | Freq: Once | INTRAVENOUS | Status: AC | PRN
  Administered 2016-10-29: 9.82 via INTRAVENOUS

## 2016-11-01 ENCOUNTER — Encounter (HOSPITAL_COMMUNITY): Payer: Medicare FFS

## 2016-11-01 ENCOUNTER — Encounter (HOSPITAL_COMMUNITY): Payer: Medicare FFS | Attending: Adult Health | Admitting: Hematology & Oncology

## 2016-11-01 ENCOUNTER — Encounter (HOSPITAL_COMMUNITY): Payer: Self-pay | Admitting: Hematology & Oncology

## 2016-11-01 VITALS — BP 128/77 | HR 65 | Temp 98.1°F | Resp 18 | Wt 192.6 lb

## 2016-11-01 DIAGNOSIS — C9000 Multiple myeloma not having achieved remission: Secondary | ICD-10-CM | POA: Diagnosis present

## 2016-11-01 DIAGNOSIS — I10 Essential (primary) hypertension: Secondary | ICD-10-CM | POA: Diagnosis not present

## 2016-11-01 DIAGNOSIS — Z7901 Long term (current) use of anticoagulants: Secondary | ICD-10-CM | POA: Insufficient documentation

## 2016-11-01 DIAGNOSIS — D7589 Other specified diseases of blood and blood-forming organs: Secondary | ICD-10-CM | POA: Insufficient documentation

## 2016-11-01 DIAGNOSIS — M25511 Pain in right shoulder: Secondary | ICD-10-CM

## 2016-11-01 DIAGNOSIS — Z86718 Personal history of other venous thrombosis and embolism: Secondary | ICD-10-CM | POA: Insufficient documentation

## 2016-11-01 DIAGNOSIS — H409 Unspecified glaucoma: Secondary | ICD-10-CM | POA: Diagnosis not present

## 2016-11-01 DIAGNOSIS — G8929 Other chronic pain: Secondary | ICD-10-CM

## 2016-11-01 DIAGNOSIS — E538 Deficiency of other specified B group vitamins: Secondary | ICD-10-CM

## 2016-11-01 DIAGNOSIS — D509 Iron deficiency anemia, unspecified: Secondary | ICD-10-CM

## 2016-11-01 DIAGNOSIS — D472 Monoclonal gammopathy: Secondary | ICD-10-CM

## 2016-11-01 DIAGNOSIS — D500 Iron deficiency anemia secondary to blood loss (chronic): Secondary | ICD-10-CM

## 2016-11-01 DIAGNOSIS — K921 Melena: Secondary | ICD-10-CM | POA: Diagnosis not present

## 2016-11-01 DIAGNOSIS — D510 Vitamin B12 deficiency anemia due to intrinsic factor deficiency: Secondary | ICD-10-CM

## 2016-11-01 LAB — COMPREHENSIVE METABOLIC PANEL
ALT: 14 U/L — AB (ref 17–63)
AST: 32 U/L (ref 15–41)
Albumin: 3.1 g/dL — ABNORMAL LOW (ref 3.5–5.0)
Alkaline Phosphatase: 45 U/L (ref 38–126)
Anion gap: 4 — ABNORMAL LOW (ref 5–15)
BUN: 15 mg/dL (ref 6–20)
CHLORIDE: 102 mmol/L (ref 101–111)
CO2: 26 mmol/L (ref 22–32)
Calcium: 8.7 mg/dL — ABNORMAL LOW (ref 8.9–10.3)
Creatinine, Ser: 1.22 mg/dL (ref 0.61–1.24)
GFR calc non Af Amer: 53 mL/min — ABNORMAL LOW (ref >60)
Glucose, Bld: 74 mg/dL (ref 65–99)
POTASSIUM: 3.8 mmol/L (ref 3.5–5.1)
SODIUM: 132 mmol/L — AB (ref 135–145)
Total Bilirubin: 0.6 mg/dL (ref 0.3–1.2)
Total Protein: 9.6 g/dL — ABNORMAL HIGH (ref 6.5–8.1)

## 2016-11-01 LAB — CBC WITH DIFFERENTIAL/PLATELET
Basophils Absolute: 0 10*3/uL (ref 0.0–0.1)
Basophils Relative: 0 %
EOS ABS: 0.1 10*3/uL (ref 0.0–0.7)
EOS PCT: 4 %
HCT: 36.9 % — ABNORMAL LOW (ref 39.0–52.0)
Hemoglobin: 11.5 g/dL — ABNORMAL LOW (ref 13.0–17.0)
LYMPHS ABS: 1.5 10*3/uL (ref 0.7–4.0)
Lymphocytes Relative: 49 %
MCH: 30.3 pg (ref 26.0–34.0)
MCHC: 31.2 g/dL (ref 30.0–36.0)
MCV: 97.4 fL (ref 78.0–100.0)
Monocytes Absolute: 0.5 10*3/uL (ref 0.1–1.0)
Monocytes Relative: 15 %
Neutro Abs: 1 10*3/uL — ABNORMAL LOW (ref 1.7–7.7)
Neutrophils Relative %: 32 %
PLATELETS: 133 10*3/uL — AB (ref 150–400)
RBC: 3.79 MIL/uL — AB (ref 4.22–5.81)
RDW: 16.3 % — AB (ref 11.5–15.5)
WBC: 3.2 10*3/uL — AB (ref 4.0–10.5)

## 2016-11-01 LAB — IRON AND TIBC
IRON: 53 ug/dL (ref 45–182)
SATURATION RATIOS: 16 % — AB (ref 17.9–39.5)
TIBC: 332 ug/dL (ref 250–450)
UIBC: 279 ug/dL

## 2016-11-01 LAB — FOLATE: FOLATE: 20.2 ng/mL (ref >5.9)

## 2016-11-01 LAB — FERRITIN: FERRITIN: 23 ng/mL — AB (ref 24–336)

## 2016-11-01 LAB — LACTATE DEHYDROGENASE: LDH: 195 U/L — ABNORMAL HIGH (ref 98–192)

## 2016-11-01 LAB — VITAMIN B12: Vitamin B-12: 348 pg/mL (ref 180–914)

## 2016-11-01 NOTE — Patient Instructions (Addendum)
[place] at [facility] Discharge Instructions  RECOMMENDATIONS MADE BY THE CONSULTANT AND ANY TEST RESULTS WILL BE SENT TO YOUR REFERRING PHYSICIAN.  You were seen today by Dr. Youlanda Roys will be referred to Dr. Karsten Fells will have right shoulder films done Follow up in 3 months with lab work   Thank you for choosing [place] at [facility] to provide your oncology and hematology care.  To afford each patient quality time with our provider, please arrive at least 15 minutes before your scheduled appointment time.    If you have a lab appointment with the [place] please come in thru the  Main Entrance and check in at the main information desk  You need to re-schedule your appointment should you arrive 10 or more minutes late.  We strive to give you quality time with our providers, and arriving late affects you and other patients whose appointments are after yours.  Also, if you no show three or more times for appointments you may be dismissed from the clinic at the providers discretion.     Again, thank you for choosing [facility].  Our hope is that these requests will decrease the amount of time that you wait before being seen by our physicians.       _____________________________________________________________  Should you have questions after your visit to [facility], please contact our office at [phone] between the hours of 8:30 a.m. and 4:30 p.m.  Voicemails left after 4:30 p.m. will not be returned until the following business day.  For prescription refill requests, have your pharmacy contact our office.       Resources For Cancer Patients and their Caregivers ? American Cancer Society: Can assist with transportation, wigs, general needs, runs Look Good Feel Better.        (269)726-6298 ? Cancer Care: Provides financial assistance, online support groups, medication/co-pay assistance.   1-800-813-HOPE 979-164-0350) ? [place] Assists [place] cancer patients and their families through emotional , educational and financial support.  7244129006 ? [place] DSS Where to apply for food stamps, Medicaid and utility assistance. 507-466-1643 ? RCATS: Transportation to medical appointments. (816) 583-1203 ? Social Security Administration: May apply for disability if have a Stage IV cancer. 564-742-1788 (314)363-4510 ? [employer], Disability and Transit Services: Assists with nutrition, care and transit needs. [place] Support Programs: @10RELATIVEDAYS @ > Cancer Support Group  2nd Tuesday of the month 1pm-2pm, Journey Room  > Creative Journey  3rd Tuesday of the month 1130am-1pm, Journey Room  > Look Good Feel Better  1st Wednesday of the month 10am-12 noon, Journey Room (Call [place] to register [phone])

## 2016-11-01 NOTE — Progress Notes (Signed)
[facility] PROGRESS NOTE  [address], [place] [address]   CLINIC:  Medical Oncology  REASON FOR CONSULTATION:  Smoldering multiple myeloma Iron-deficiency anemia   REFERRAL FROM:  [facility]  Dr. Audree Camel   PRIMARY CARE PROVIDER: Dr. Stoney Bang [phone]   BRIEF ONCOLOGY HISTORY:  Multiple Myeloma-Patient was referred to Dr. Jacquiline Doe, further work-up suggested malignancy as possible etiology. In 12/2015-patient underwent bone marrow biopsy showing 13% myeloma cells, hypercellular bone marrow 40%, FISH IGH/14q+, and gain of Ch9 & CCNDI/11q; Amyloid stains (-). No bone pain, muscle weakness.  No treatment recommended; has been under surveillance since that time.  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   Left LE DVT-Diagnosed about 7 years ago; on anti-coagulation with Coumadin 5 mg daily; managed by PCP.    INTERVAL HISTORY:  Dale Suarez presents to the [facility] to establish care after being referred by Dr. Jacquiline Doe at [facility].  He tells me that he was referred to see Dr. Jacquiline Doe back in 12/2015, who recommended he undergo bone marrow biopsy on suspicion of plasma cell disease.  He was found to have multiple myeloma, but no active treatment was recommended at that time "because it wasn't too bad that I needed any treatment."  He subsequently underwent PET scan evaluation "and he told me that everything looked good on that scan."    [name] presents to the cancer center today unaccompanied. I have reviewed the labs and PET scan results with the patient.   He has a knot on his right and left shoulder that have not gotten any better. He says they're on the same spot on both shoulders. He believes it could be bone spurs. He would like to know if this has anything to do with his "blood  situation". They don't hurt him or cause any restrictions in his life, he just noticed them one day. If these knots aren't life threatening, he would like to just focus on the myeloma.  He had a cold the week of his PET scan. He notes that he currently feels well without any other symptoms such as fever or chills. No cough.   He has been feeling good and he has a good amount of energy. Appetite is good. Holidays were enjoyable. Traveled to see his daughter.  Denies any other complaints.   ADDITIONAL REVIEW OF SYSTEMS: Review of Systems  Constitutional: Negative.   HENT: Negative.   Eyes: Negative.   Respiratory: Negative.   Cardiovascular: Negative.   Gastrointestinal: Negative.   Genitourinary: Negative.   Musculoskeletal: Negative.   Skin: Negative.   Neurological: Negative.   Endo/Heme/Allergies: Negative.   Psychiatric/Behavioral: Negative.   All other systems reviewed and are negative. 14 point review of systems was performed and is negative except as detailed under history of present illness and above   PAST MEDICAL & SURGICAL HISTORY:  Past Medical History:  Diagnosis Date  . Anemia   . Glaucoma   . Hypertension    History reviewed. No pertinent surgical history.   SOCIAL HISTORY:  Mr. Vu is widowed and lives alone in [place], [place].  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 He  is now retired.  He has one daughter, Dale Suarez, who is his Press photographer and is a Architect and works as the Personal assistant on the mother/baby unit at [employer]. He has 4 grandsons, ranging in ages from 21-35.  He enjoys watching TV, particularly old westerns.  He also enjoys walking, which he does every day for about 0.5 mile/day.    CURRENT MEDICATIONS:  Current  Outpatient Prescriptions on File Prior to Visit  Medication Sig Dispense Refill  . albuterol (PROVENTIL HFA;VENTOLIN HFA) 108 (90 Base) MCG/ACT inhaler Inhale into the lungs.    . brinzolamide (AZOPT) 1 % ophthalmic suspension Place 1 drop into both eyes 2 (two) times daily.    . Cholecalciferol (VITAMIN D3) 2000 units TABS Take by mouth.    . Cod Liver Oil OIL Take by mouth.    . cyanocobalamin 500 MCG tablet Take 500 mcg by mouth daily.    Marland Kitchen doxazosin (CARDURA) 8 MG tablet Take by mouth.    . Glucosamine Sulfate 1000 MG CAPS Take by mouth.    . hydrochlorothiazide (HYDRODIURIL) 50 MG tablet Take by mouth.    . latanoprost (XALATAN) 0.005 % ophthalmic solution Place 1 drop into both eyes at bedtime.    Marland Kitchen loratadine (CLARITIN) 10 MG tablet Take by mouth.    . warfarin (COUMADIN) 5 MG tablet Take by mouth.     No current facility-administered medications on file prior to visit.      ALLERGIES: No Known Allergies  PHYSICAL EXAM:  Vitals:   11/01/16 1044  BP: 128/77  Pulse: 65  Resp: 18  Temp: 98.1 F (36.7 C)   Filed Weights   11/01/16 1044  Weight: 192 lb 9.6 oz (87.4 kg)     Physical Exam  Constitutional: He is oriented to person, place, and time and well-developed, well-nourished, and in no distress.  Pt was able to get on exam table without assistance.  Wears glasses.  HENT:  Head: Normocephalic and atraumatic.  Mouth/Throat: Oropharynx is clear and moist.  Eyes: Conjunctivae and EOM are normal. Pupils are equal, round, and reactive to light. No scleral icterus.  Neck: Normal range of motion. Neck supple.  Cardiovascular: Normal rate, regular rhythm and normal heart sounds.   Pulmonary/Chest: Effort normal and breath sounds normal.  Abdominal: Soft. Bowel sounds are normal. He exhibits no distension and no mass. There is no tenderness. There is no rebound and no guarding.  Musculoskeletal: Normal range of motion.  Prominent acromion   Lymphadenopathy:    He has no  cervical adenopathy.  Neurological: He is alert and oriented to person, place, and time. Gait normal.  Skin: Skin is warm and dry.  Psychiatric: Mood, memory, affect and judgment normal.  Nursing note and vitals reviewed.    LABORATORY DATA:  CBC    Component Value Date/Time   WBC 3.2 (L) 11/01/2016 0936   RBC 3.79 (L) 11/01/2016 0936   HGB 11.5 (L) 11/01/2016 0936   HCT 36.9 (L) 11/01/2016 0936   PLT 133 (L) 11/01/2016 0936   MCV 97.4 11/01/2016 0936   MCH 30.3 11/01/2016 0936   MCHC 31.2 11/01/2016 0936   RDW 16.3 (H) 11/01/2016 0936   LYMPHSABS 1.5 11/01/2016 0936   MONOABS 0.5 11/01/2016 0936   EOSABS 0.1 11/01/2016 0936   BASOSABS 0.0 11/01/2016 0936   CMP Latest Ref Rng & Units 11/01/2016 09/30/2016 06/29/2016  Glucose 65 - 99 mg/dL 74 82 85  BUN 6 - 20  mg/dL 15 16 19   Creatinine 0.61 - 1.24 mg/dL 1.22 1.31(H) 1.16  Sodium 135 - 145 mmol/L 132(L) 131(L) 134(L)  Potassium 3.5 - 5.1 mmol/L 3.8 4.0 3.7  Chloride 101 - 111 mmol/L 102 101 103  CO2 22 - 32 mmol/L 26 27 26   Calcium 8.9 - 10.3 mg/dL 8.7(L) 8.5(L) 9.1  Total Protein 6.5 - 8.1 g/dL 9.6(H) 9.7(H) 9.9(H)  Total Bilirubin 0.3 - 1.2 mg/dL 0.6 0.5 0.4  Alkaline Phos 38 - 126 U/L 45 50 47  AST 15 - 41 U/L 32 33 28  ALT 17 - 63 U/L 14(L) 14(L) 12(L)    DIAGNOSTIC IMAGING:   Study Result   CLINICAL DATA:  Subsequent treatment strategy for smoldering myeloma. Restaging.  EXAM: NUCLEAR MEDICINE PET WHOLE BODY  TECHNIQUE: 9.8 mCi F-18 FDG was injected intravenously. Full-ring PET imaging was performed from the vertex to the feet after the radiotracer. CT data was obtained and used for attenuation correction and anatomic localization.  FASTING BLOOD GLUCOSE:  Value: 94 mg/dl  COMPARISON:  01/30/2016 PET-CT.  FINDINGS: HEAD/NECK  No hypermetabolic activity in the scalp. No hypermetabolic cervical lymph nodes. Symmetric hypermetabolism in the glottis without CT correlate, probably  physiologic.  CHEST  Mildly atherosclerotic nonaneurysmal thoracic aorta. No hypermetabolic axillary lymphadenopathy.  Newly mildly hypermetabolic nonenlarged 0.7 cm right lower paratracheal node with max SUV 3.8 (series 3/ image 102), stable in size.  Mildly hypermetabolic right hilar lymph node with max SUV 4.7, poorly delineated on the noncontrast CT images, previous max SUV 3.6, mildly increased.  No additional hypermetabolic mediastinal or hilar lymph nodes. No significant pleural effusions (trace layering pleural fluid bilaterally is stable and probably physiologic). No acute consolidative airspace disease, lung masses or significant pulmonary nodules.  ABDOMEN/PELVIS  No abnormal hypermetabolic activity within the liver, pancreas, adrenal glands, or spleen. No hypermetabolic lymph nodes in the abdomen or pelvis. Simple 2.2 cm left liver lobe cyst. Additional subcentimeter hypodense liver lesions are too small to characterize and stable, suggesting benign lesions. Simple 4.0 cm renal cyst in the lateral interpolar left kidney. Atherosclerotic nonaneurysmal abdominal aorta.  Mildly enlarged prostate. Nonspecific new mild focal hypermetabolism in the left prostate peripheral zone with max SUV 5.9 without associated discrete mass on the CT images.  SKELETON  No skeletal foci of hypermetabolism.  EXTREMITIES  No abnormal hypermetabolic activity in the lower extremities.  IMPRESSION: 1. No hypermetabolic osseous myeloma. 2. Mildly hypermetabolic right hilar lymph node, mildly increased in metabolism since 01/30/2016. Newly mildly hypermetabolic nonenlarged right lower paratracheal mediastinal lymph node. No acute pulmonary disease. These nodes are nonspecific and warrant attention on follow-up chest CT with IV contrast versus follow-up PET-CT. 3. Mildly enlarged prostate. New nonspecific mild focal hypermetabolism in the left prostate peripheral zone,  without associated discrete mass on the CT images. Recommend correlation with serum PSA. 4. Aortic atherosclerosis.   Electronically Signed   By: Ilona Sorrel M.D.   On: 10/29/2016 14:22     PET scan (outside records) showed:  1. Left adrenal hypermetabolism without well-defined CT correlate; felt to be physiologic. 2. No evidence of bone metastatic process.     ASSESSMENT/PLAN: Smoldering Myeloma Iron deficiency Anemia Chronic anticoagulation Hemoccult positive stool Iron deficiency History LLE DVT  Labs are reviewed and stable. Results are noted above.  PET/CT was reviewed and showed no evidence of lytic bone disease or soft tissue involvement. Would continue with coumadin as it is controversial whether patients with smoldering myeloma/MGUS are more prone to DVT. In  addition will continue to follow labs in accordance with guidelines. Will follow iron levels as well. Given hemoccult positive stool, ongoing anticoagulation and iron deficiency will also continue to address GI evaluation moving forward.    X ray today to look at his shoulders. Refer to Dr. Aline Brochure in orthopedics.  CT scan of chest in Spring due to unspecified lymph node in PET scan. He had a cold when the scan was done. I reassured him.  He will return for a follow up in 3 months.   Orders Placed This Encounter  Procedures  . DG Shoulder Right    Standing Status:   Future    Standing Expiration Date:   12/30/2017    Order Specific Question:   Reason for Exam (SYMPTOM  OR DIAGNOSIS REQUIRED)    Answer:   R shoulder pain and "knot"    Order Specific Question:   Preferred imaging location?    Answer:   [facility]  . CBC with Differential    Standing Status:   Future    Standing Expiration Date:   11/01/2017  . Comprehensive metabolic panel    Standing Status:   Future    Standing Expiration Date:   11/01/2017  . Kappa/lambda light chains    Standing Status:   Future    Standing Expiration Date:    11/01/2017  . Protein electrophoresis, serum    Standing Status:   Future    Standing Expiration Date:   11/01/2017  . Immunofixation electrophoresis    Standing Status:   Future    Standing Expiration Date:   11/01/2017  . Ferritin    Standing Status:   Future    Standing Expiration Date:   11/01/2017     From Dr. Reynaldo Minium initial consultation 11/2015 at [facility]: Plan  1. 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 2. We will proceed with further workup as below this has been discussed with patient's daughter Andrey Campanile, phone number is [phone], and patient might require bone marrow biopsy which will be scheduled. Issues concerning diagnosis prognosis and further management have been discussed with the patient and his daughter. Total time spent face-to-face/with the patient and or Family 45 min 3. Follow-up with oncology on on December 31 2015.  Orders Placed This Encounter  Procedures  . XR Bone Survey Complete  . CBC And Differential  . Comprehensive Metabolic Panel  . Lactate Dehydrogenase  . IFE, PE and FLC, Serum  . Immunoglobulins A/E/G/M, Serum  . Beta 2 Microglobulin, Serum  . Protein Electrophoresis 24 Hour Urine  . U IFE LCE  . Sedimentation Rate, Automated   Patient is instructed to call prior to next appointment for questions, concerns or new symptoms. The patient voiced understanding of disease state and current treatment/instructions. Questions were answered appropriately.  Portions of this note were created using voice recognition software. Minor syntax errors, grammatical content or punctuation errors may have occurred unintentionally. Please notify Pryor Curia if changes are necessary.  Milus Height, MD 12/18/2015 / 5:37 PM    All questions were answered.   This document serves as a record of services personally  performed by Ancil Linsey, MD. It was created on her behalf by Martinique Casey, a trained medical scribe. The creation of this record is based on the scribe's personal observations and the provider's statements to them.  This document has been checked and approved by the attending provider.  I have reviewed the above documentation for accuracy and completeness and I agree with the above.  This note was electronically signed.  Molli Hazard, MD

## 2016-11-02 ENCOUNTER — Encounter (HOSPITAL_COMMUNITY): Payer: Self-pay | Admitting: Hematology & Oncology

## 2016-11-02 LAB — PROTEIN ELECTROPHORESIS, SERUM
A/G RATIO SPE: 0.6 — AB (ref 0.7–1.7)
Albumin ELP: 3.7 g/dL (ref 2.9–4.4)
Alpha-1-Globulin: 0.2 g/dL (ref 0.0–0.4)
Alpha-2-Globulin: 0.7 g/dL (ref 0.4–1.0)
Beta Globulin: 1 g/dL (ref 0.7–1.3)
GLOBULIN, TOTAL: 6.3 g/dL — AB (ref 2.2–3.9)
Gamma Globulin: 4.4 g/dL — ABNORMAL HIGH (ref 0.4–1.8)
M-Spike, %: 3.8 g/dL — ABNORMAL HIGH
PDF: 0
TOTAL PROTEIN ELP: 10 g/dL — AB (ref 6.0–8.5)

## 2016-11-02 LAB — KAPPA/LAMBDA LIGHT CHAINS
KAPPA, LAMDA LIGHT CHAIN RATIO: 12.67 — AB (ref 0.26–1.65)
Kappa free light chain: 191.3 mg/L — ABNORMAL HIGH (ref 3.3–19.4)
LAMDA FREE LIGHT CHAINS: 15.1 mg/L (ref 5.7–26.3)

## 2016-11-03 LAB — IMMUNOFIXATION ELECTROPHORESIS
IGG (IMMUNOGLOBIN G), SERUM: 5093 mg/dL — AB (ref 700–1600)
IgA: <5 mg/dL — ABNORMAL LOW (ref 61–437)
IgM, Serum: 16 mg/dL (ref 15–143)
TOTAL PROTEIN ELP: 9.8 g/dL — AB (ref 6.0–8.5)

## 2016-11-17 ENCOUNTER — Other Ambulatory Visit (HOSPITAL_COMMUNITY): Payer: Self-pay | Admitting: Hematology & Oncology

## 2016-11-22 ENCOUNTER — Encounter (HOSPITAL_COMMUNITY): Payer: Self-pay | Admitting: Hematology & Oncology

## 2016-11-26 ENCOUNTER — Encounter: Payer: Self-pay | Admitting: Orthopedic Surgery

## 2016-11-26 ENCOUNTER — Encounter (HOSPITAL_COMMUNITY): Payer: Self-pay

## 2016-11-26 ENCOUNTER — Ambulatory Visit (INDEPENDENT_AMBULATORY_CARE_PROVIDER_SITE_OTHER): Payer: Medicare FFS

## 2016-11-26 ENCOUNTER — Ambulatory Visit (INDEPENDENT_AMBULATORY_CARE_PROVIDER_SITE_OTHER): Payer: Medicare FFS | Admitting: Orthopedic Surgery

## 2016-11-26 ENCOUNTER — Encounter (HOSPITAL_COMMUNITY): Payer: Medicare FFS | Attending: Adult Health

## 2016-11-26 VITALS — BP 119/80 | HR 96 | Wt 190.0 lb

## 2016-11-26 VITALS — BP 106/58 | HR 70 | Temp 98.0°F | Resp 16

## 2016-11-26 DIAGNOSIS — M5432 Sciatica, left side: Secondary | ICD-10-CM

## 2016-11-26 DIAGNOSIS — Z7901 Long term (current) use of anticoagulants: Secondary | ICD-10-CM | POA: Insufficient documentation

## 2016-11-26 DIAGNOSIS — C9000 Multiple myeloma not having achieved remission: Secondary | ICD-10-CM | POA: Insufficient documentation

## 2016-11-26 DIAGNOSIS — D500 Iron deficiency anemia secondary to blood loss (chronic): Secondary | ICD-10-CM

## 2016-11-26 DIAGNOSIS — D509 Iron deficiency anemia, unspecified: Secondary | ICD-10-CM | POA: Insufficient documentation

## 2016-11-26 DIAGNOSIS — H409 Unspecified glaucoma: Secondary | ICD-10-CM | POA: Insufficient documentation

## 2016-11-26 DIAGNOSIS — K922 Gastrointestinal hemorrhage, unspecified: Secondary | ICD-10-CM

## 2016-11-26 DIAGNOSIS — S76312A Strain of muscle, fascia and tendon of the posterior muscle group at thigh level, left thigh, initial encounter: Secondary | ICD-10-CM | POA: Diagnosis not present

## 2016-11-26 DIAGNOSIS — M25562 Pain in left knee: Secondary | ICD-10-CM | POA: Diagnosis not present

## 2016-11-26 DIAGNOSIS — M79605 Pain in left leg: Secondary | ICD-10-CM | POA: Diagnosis not present

## 2016-11-26 DIAGNOSIS — I10 Essential (primary) hypertension: Secondary | ICD-10-CM | POA: Insufficient documentation

## 2016-11-26 DIAGNOSIS — Z86718 Personal history of other venous thrombosis and embolism: Secondary | ICD-10-CM | POA: Insufficient documentation

## 2016-11-26 DIAGNOSIS — D7589 Other specified diseases of blood and blood-forming organs: Secondary | ICD-10-CM | POA: Insufficient documentation

## 2016-11-26 MED ORDER — SODIUM CHLORIDE 0.9 % IV SOLN
INTRAVENOUS | Status: DC
  Administered 2016-11-26: 11:00:00 via INTRAVENOUS

## 2016-11-26 MED ORDER — PREDNISONE 10 MG PO TABS: 10.0000 mg | ORAL_TABLET | Freq: Every day | ORAL | 1 refills | Status: DC

## 2016-11-26 MED ORDER — METHOCARBAMOL 500 MG PO TABS: 500.0000 mg | ORAL_TABLET | Freq: Three times a day (TID) | ORAL | 1 refills | Status: DC

## 2016-11-26 MED ORDER — SODIUM CHLORIDE 0.9 % IV SOLN
510.0000 mg | Freq: Once | INTRAVENOUS | Status: AC
  Administered 2016-11-26: 510 mg via INTRAVENOUS
  Filled 2016-11-26: qty 17

## 2016-11-26 NOTE — Patient Instructions (Signed)
[facility] at [facility] Discharge Instructions  RECOMMENDATIONS MADE BY THE CONSULTANT AND ANY TEST RESULTS WILL BE SENT TO YOUR REFERRING PHYSICIAN.  Feraheme given today Follow up as scheduled.  Thank you for choosing [facility] at [facility] to provide your oncology and hematology care.  To afford each patient quality time with our provider, please arrive at least 15 minutes before your scheduled appointment time.    If you have a lab appointment with the Cancer Center please come in thru the  Main Entrance and check in at the main information desk  You need to re-schedule your appointment should you arrive 10 or more minutes late.  We strive to give you quality time with our providers, and arriving late affects you and other patients whose appointments are after yours.  Also, if you no show three or more times for appointments you may be dismissed from the clinic at the providers discretion.     Again, thank you for choosing [facility].  Our hope is that these requests will decrease the amount of time that you wait before being seen by our physicians.       _____________________________________________________________  Should you have questions after your visit to [facility], please contact our office at [phone] between the hours of 8:30 a.m. and 4:30 p.m.  Voicemails left after 4:30 p.m. will not be returned until the following business day.  For prescription refill requests, have your pharmacy contact our office.       Resources For Cancer Patients and their Caregivers ? American Cancer Society: Can assist with transportation, wigs, general needs, runs Look Good Feel Better.        1-888-227-6333 ? Cancer Care: Provides financial assistance, online support groups, medication/co-pay assistance.  1-800-813-HOPE (4673) ? [facility] Assists [place] cancer patients and  their families through emotional , educational and financial support.  336-427-4357 ? [place] DSS Where to apply for food stamps, Medicaid and utility assistance. 336-342-1394 ? [facility]: Transportation to medical appointments. 336-347-2287 ? Social Security Administration: May apply for disability if have a Stage IV cancer. 336-342-7796 1-800-772-1213 ? [place] Aging, Disability and Transit Services: Assists with nutrition, care and transit needs. 336-349-2343  Cancer Center Support Programs: @10RELATIVEDAYS@ > Cancer Support Group  2nd Tuesday of the month 1pm-2pm, Journey Room  > Creative Journey  3rd Tuesday of the month 1130am-1pm, Journey Room  > Look Good Feel Better  1st Wednesday of the month 10am-12 noon, Journey Room (Call American Cancer Society to register 1-800-395-5775)   

## 2016-11-26 NOTE — Progress Notes (Signed)
Patient received Feraheme today. Patient tolerated it well without problems. Vitals stable and discharged home from clinic ambulatory. Follow up as scheduled.

## 2016-11-26 NOTE — Progress Notes (Signed)
Chief Complaint  Patient presents with  . Knee Pain    left knee and upper leg pain    83 year old male presented for shoulder problem but wants to be seen for his left leg. He reports acute onset of pain behind his left knee and behind his left thigh with pain in the "leaders" no history of trauma he did have some lower leg pain associated with it which has subsequently resolved. He has pain when he sitting with pressure on the back of his thigh it appears to be constant he has not had any treatment  He denies any fever shortness of breath or chest pain. No related urinary symptoms or bowel symptoms  Past Medical History:  Diagnosis Date  . Anemia   . Glaucoma   . Hypertension    No past surgical history on file.  He reported no history of surgery  BP 119/80   Pulse 96   Wt 190 lb (86.2 kg)   BMI 27.07 kg/m  He is an awake alert male is oriented 3 his mood is pleasant her affect is normal he has a slight limp favoring his left leg  He has no tenderness in his back has some decreased range of motion in the spine but no increased muscle tension and no skin lesions  Distally has normal perfusion to both extremities no edema.  He has no sensory loss in either foot.  He has some tenderness in the back of his thigh  His knee is nontender no effusion all ligaments are stable on the left side  Plain films are obtained knee and back  Impression Mild sciatica versus hamstring strain  Patient is on warfarin so I will not prescribe anti-inflammatories I will recommend heating pad, Tylenol Extra Strength and 10 days of 10 mg prednisone once a day, Robaxin 500 mg 3 times a day  Follow-up as needed

## 2016-11-26 NOTE — Patient Instructions (Addendum)
Impression Mild sciatica versus hamstring strain  Patient is on warfarin so I will not prescribe anti-inflammatories  I will recommend:   heating pad,   Tylenol Extra Strength and   10 days of 10 mg prednisone once a day  Robaxin 500 mg 3 times a day

## 2017-01-31 ENCOUNTER — Encounter (HOSPITAL_COMMUNITY): Payer: Medicare FFS | Attending: Oncology | Admitting: Oncology

## 2017-01-31 ENCOUNTER — Encounter (HOSPITAL_COMMUNITY): Payer: Medicare FFS

## 2017-01-31 VITALS — BP 112/85 | HR 69 | Temp 98.3°F | Resp 20 | Wt 191.7 lb

## 2017-01-31 DIAGNOSIS — Z86718 Personal history of other venous thrombosis and embolism: Secondary | ICD-10-CM | POA: Diagnosis not present

## 2017-01-31 DIAGNOSIS — C9000 Multiple myeloma not having achieved remission: Secondary | ICD-10-CM | POA: Diagnosis not present

## 2017-01-31 DIAGNOSIS — D509 Iron deficiency anemia, unspecified: Secondary | ICD-10-CM

## 2017-01-31 DIAGNOSIS — D638 Anemia in other chronic diseases classified elsewhere: Secondary | ICD-10-CM | POA: Diagnosis present

## 2017-01-31 DIAGNOSIS — K921 Melena: Secondary | ICD-10-CM

## 2017-01-31 DIAGNOSIS — Z7901 Long term (current) use of anticoagulants: Secondary | ICD-10-CM

## 2017-01-31 DIAGNOSIS — D510 Vitamin B12 deficiency anemia due to intrinsic factor deficiency: Secondary | ICD-10-CM

## 2017-01-31 DIAGNOSIS — D472 Monoclonal gammopathy: Secondary | ICD-10-CM

## 2017-01-31 LAB — CBC WITH DIFFERENTIAL/PLATELET
Basophils Absolute: 0 10*3/uL (ref 0.0–0.1)
Basophils Relative: 1 %
EOS PCT: 3 %
Eosinophils Absolute: 0.1 10*3/uL (ref 0.0–0.7)
HEMATOCRIT: 39.2 % (ref 39.0–52.0)
Hemoglobin: 12.8 g/dL — ABNORMAL LOW (ref 13.0–17.0)
LYMPHS ABS: 1.6 10*3/uL (ref 0.7–4.0)
LYMPHS PCT: 42 %
MCH: 32.1 pg (ref 26.0–34.0)
MCHC: 32.7 g/dL (ref 30.0–36.0)
MCV: 98.2 fL (ref 78.0–100.0)
Monocytes Absolute: 0.4 10*3/uL (ref 0.1–1.0)
Monocytes Relative: 12 %
NEUTROS ABS: 1.6 10*3/uL — AB (ref 1.7–7.7)
Neutrophils Relative %: 42 %
Platelets: 140 10*3/uL — ABNORMAL LOW (ref 150–400)
RBC: 3.99 MIL/uL — AB (ref 4.22–5.81)
RDW: 16.3 % — AB (ref 11.5–15.5)
WBC: 3.7 10*3/uL — AB (ref 4.0–10.5)

## 2017-01-31 LAB — COMPREHENSIVE METABOLIC PANEL
ALK PHOS: 44 U/L (ref 38–126)
ALT: 13 U/L — ABNORMAL LOW (ref 17–63)
AST: 29 U/L (ref 15–41)
Albumin: 3.2 g/dL — ABNORMAL LOW (ref 3.5–5.0)
BILIRUBIN TOTAL: 0.4 mg/dL (ref 0.3–1.2)
BUN: 15 mg/dL (ref 6–20)
CO2: 27 mmol/L (ref 22–32)
Calcium: 8.8 mg/dL — ABNORMAL LOW (ref 8.9–10.3)
Chloride: 105 mmol/L (ref 101–111)
Creatinine, Ser: 1.14 mg/dL (ref 0.61–1.24)
GFR calc non Af Amer: 58 mL/min — ABNORMAL LOW (ref >60)
GLUCOSE: 74 mg/dL (ref 65–99)
POTASSIUM: 3.8 mmol/L (ref 3.5–5.1)
SODIUM: 134 mmol/L — AB (ref 135–145)
TOTAL PROTEIN: 9.6 g/dL — AB (ref 6.5–8.1)

## 2017-01-31 LAB — VITAMIN B12: Vitamin B-12: 787 pg/mL (ref 180–914)

## 2017-01-31 LAB — IRON AND TIBC
IRON: 53 ug/dL (ref 45–182)
SATURATION RATIOS: 20 % (ref 17.9–39.5)
TIBC: 267 ug/dL (ref 250–450)
UIBC: 214 ug/dL

## 2017-01-31 LAB — FOLATE: Folate: 24.2 ng/mL (ref >5.9)

## 2017-01-31 LAB — LACTATE DEHYDROGENASE: LDH: 196 U/L — ABNORMAL HIGH (ref 98–192)

## 2017-01-31 LAB — FERRITIN: FERRITIN: 57 ng/mL (ref 24–336)

## 2017-01-31 NOTE — Progress Notes (Signed)
[name], [place] [address]   CLINIC:  Medical Oncology  REASON FOR CONSULTATION:  Smoldering multiple myeloma Iron-deficiency anemia   REFERRAL FROM:  [facility]  Dr. Audree Camel  PRIMARY CARE PROVIDER: Dr. Stoney Bang [phone]  BRIEF ONCOLOGY HISTORY:  Multiple Myeloma-Patient was referred to Dr. Jacquiline Doe, further work-up suggested malignancy as possible etiology. In 12/2015-patient underwent bone marrow biopsy showing 13% myeloma cells, hypercellular bone marrow 40%, FISH IGH/14q+, and gain of Ch9 & CCNDI/11q; Amyloid stains (-). No bone pain, muscle weakness.  No treatment recommended; has been under surveillance since that time.  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  Received a dose of feraheme 510 mg on 11/26/16. Left LE DVT-Diagnosed about 7 years ago; on anti-coagulation with Coumadin 5 mg daily; managed by PCP.    INTERVAL HISTORY:  [name] presents to the [facility] for routine follow up. He reports he is doing "fine," though he reports left knee pain which is "cramping." He had x-rays performed on the knee recently with his PCP, which he reports revealed a pinched nerve. The patient was given Prednisone, which he has not finished at this time. The patient's discomfort has mostly subsided at this time.  The patient denies chest pain, shortness of breath, or abdominal pain. He reports he had a cold recently, which has subsided now. He reports a normal appetite.   ADDITIONAL REVIEW OF SYSTEMS: Review of Systems  Constitutional: Negative.        Normal appetite.  HENT: Negative.   Eyes: Negative.   Respiratory: Negative.  Negative for cough, shortness of breath and wheezing.   Cardiovascular: Negative.  Negative for chest pain.  Gastrointestinal: Negative.  Negative for abdominal  pain.  Genitourinary: Negative.   Musculoskeletal: Negative.        Reports some left knee discomfort.  Skin: Negative.   Neurological: Negative.   Endo/Heme/Allergies: Negative.   Psychiatric/Behavioral: Negative.   All other systems reviewed and are negative. 14 point review of systems was performed and is negative except as detailed under history of present illness and above   PAST MEDICAL & SURGICAL HISTORY:  Past Medical History:  Diagnosis Date  . Anemia   . Glaucoma   . Hypertension    No past surgical history on file.   SOCIAL HISTORY:  [name] is widowed and lives alone in [place], [place].  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 He is now retired.  He has one daughter, Dale Suarez, who is his Press photographer and is a Architect and works as the Personal assistant on the mother/baby unit at [employer]. He has 4 grandsons, ranging in ages from 21-35.  He enjoys watching TV, particularly old westerns.  He also enjoys walking, which he does every day for about 0.5 mile/day.    CURRENT MEDICATIONS:  Current Outpatient Prescriptions on File Prior to Visit  Medication Sig Dispense Refill  . albuterol (PROVENTIL HFA;VENTOLIN HFA) 108 (90 Base) MCG/ACT inhaler Inhale into the lungs.    Marland Kitchen aspirin EC 81 MG tablet Take 81 mg by mouth daily.    . brinzolamide (AZOPT) 1 %  ophthalmic suspension Place 1 drop into both eyes 2 (two) times daily.    . Cholecalciferol (VITAMIN D3) 2000 units TABS Take 2,000 Units by mouth daily.     . Cod Liver Oil OIL Take by mouth daily.     . cyanocobalamin 500 MCG tablet Take 500 mcg by mouth daily.    Marland Kitchen doxazosin (CARDURA) 8 MG tablet Take 8 mg by mouth daily.     . Glucosamine Sulfate 1000 MG CAPS Take by mouth. Takes twice a day      . hydrochlorothiazide (HYDRODIURIL) 50 MG tablet Take 50 mg by mouth daily.     Marland Kitchen latanoprost (XALATAN) 0.005 % ophthalmic solution Place 1 drop into both eyes at bedtime.    Marland Kitchen loratadine (CLARITIN) 10 MG tablet Take 10 mg by mouth daily.     . methocarbamol (ROBAXIN) 500 MG tablet Take 1 tablet (500 mg total) by mouth 3 (three) times daily. 60 tablet 1  . predniSONE (DELTASONE) 10 MG tablet Take 1 tablet (10 mg total) by mouth daily. 10 tablet 1  . Sildenafil Citrate (VIAGRA PO) Take by mouth.    . warfarin (COUMADIN) 5 MG tablet Take 5 mg by mouth one time only at 6 PM.      No current facility-administered medications on file prior to visit.      ALLERGIES: No Known Allergies  PHYSICAL EXAM:  Vitals:   01/31/17 1016  BP: 112/85  Pulse: 69  Resp: 20  Temp: 98.3 F (36.8 C)   Filed Weights   01/31/17 1016  Weight: 191 lb 11.2 oz (87 kg)     Physical Exam  Constitutional: He is oriented to person, place, and time and well-developed, well-nourished, and in no distress.  Pt was able to get on exam table without assistance.  Wears glasses.  HENT:  Head: Normocephalic and atraumatic.  Mouth/Throat: Oropharynx is clear and moist.  Eyes: Conjunctivae and EOM are normal. Pupils are equal, round, and reactive to light. No scleral icterus.  Neck: Normal range of motion. Neck supple.  Cardiovascular: Normal rate, regular rhythm and normal heart sounds.  Exam reveals no gallop and no friction rub.   No murmur heard. Pulmonary/Chest: Effort normal and breath sounds normal. No respiratory distress. He has no wheezes. He has no rales.  Abdominal: Soft. Bowel sounds are normal. He exhibits no distension and no mass. There is no tenderness. There is no rebound and no guarding.  Musculoskeletal: Normal range of motion.  Left knee swelling noted.  Lymphadenopathy:    He has no cervical adenopathy.  Neurological: He is alert and oriented to person, place, and time. Gait normal.  Skin:  Skin is warm and dry.  Psychiatric: Mood, memory, affect and judgment normal.  Nursing note and vitals reviewed.    LABORATORY DATA: I have reviewed the laboratory data below:  CBC    Component Value Date/Time   WBC 3.7 (L) 01/31/2017 0937   RBC 3.99 (L) 01/31/2017 0937   HGB 12.8 (L) 01/31/2017 0937   HCT 39.2 01/31/2017 0937   PLT 140 (L) 01/31/2017 0937   MCV 98.2 01/31/2017 0937   MCH 32.1 01/31/2017 0937   MCHC 32.7 01/31/2017 0937   RDW 16.3 (H) 01/31/2017 0937   LYMPHSABS 1.6 01/31/2017 0937   MONOABS 0.4 01/31/2017 0937   EOSABS 0.1 01/31/2017 0937   BASOSABS 0.0 01/31/2017 0937   CMP Latest Ref Rng & Units 01/31/2017 11/01/2016 09/30/2016  Glucose 65 - 99 mg/dL 74 74 82  BUN 6 - 20 mg/dL 15 15 16   Creatinine 0.61 - 1.24 mg/dL 1.14 1.22 1.31(H)  Sodium 135 - 145 mmol/L 134(L) 132(L) 131(L)  Potassium 3.5 - 5.1 mmol/L 3.8 3.8 4.0  Chloride 101 - 111 mmol/L 105 102 101  CO2 22 - 32 mmol/L 27 26 27   Calcium 8.9 - 10.3 mg/dL 8.8(L) 8.7(L) 8.5(L)  Total Protein 6.5 - 8.1 g/dL 9.6(H) 9.6(H) 9.7(H)  Total Bilirubin 0.3 - 1.2 mg/dL 0.4 0.6 0.5  Alkaline Phos 38 - 126 U/L 44 45 50  AST 15 - 41 U/L 29 32 33  ALT 17 - 63 U/L 13(L) 14(L) 14(L)    DIAGNOSTIC IMAGING:  I have personally reviewed the radiological images as listed and agreed with the findings in the report.  Study Result   CLINICAL DATA:  Subsequent treatment strategy for smoldering myeloma. Restaging.  EXAM: NUCLEAR MEDICINE PET WHOLE BODY  TECHNIQUE: 9.8 mCi F-18 FDG was injected intravenously. Full-ring PET imaging was performed from the vertex to the feet after the radiotracer. CT data was obtained and used for attenuation correction and anatomic localization.  FASTING BLOOD GLUCOSE:  Value: 94 mg/dl  COMPARISON:  01/30/2016 PET-CT.  FINDINGS: HEAD/NECK  No hypermetabolic activity in the scalp. No hypermetabolic cervical lymph nodes. Symmetric hypermetabolism in the glottis without  CT correlate, probably physiologic.  CHEST  Mildly atherosclerotic nonaneurysmal thoracic aorta. No hypermetabolic axillary lymphadenopathy.  Newly mildly hypermetabolic nonenlarged 0.7 cm right lower paratracheal node with max SUV 3.8 (series 3/ image 102), stable in size.  Mildly hypermetabolic right hilar lymph node with max SUV 4.7, poorly delineated on the noncontrast CT images, previous max SUV 3.6, mildly increased.  No additional hypermetabolic mediastinal or hilar lymph nodes. No significant pleural effusions (trace layering pleural fluid bilaterally is stable and probably physiologic). No acute consolidative airspace disease, lung masses or significant pulmonary nodules.  ABDOMEN/PELVIS  No abnormal hypermetabolic activity within the liver, pancreas, adrenal glands, or spleen. No hypermetabolic lymph nodes in the abdomen or pelvis. Simple 2.2 cm left liver lobe cyst. Additional subcentimeter hypodense liver lesions are too small to characterize and stable, suggesting benign lesions. Simple 4.0 cm renal cyst in the lateral interpolar left kidney. Atherosclerotic nonaneurysmal abdominal aorta.  Mildly enlarged prostate. Nonspecific new mild focal hypermetabolism in the left prostate peripheral zone with max SUV 5.9 without associated discrete mass on the CT images.  SKELETON  No skeletal foci of hypermetabolism.  EXTREMITIES  No abnormal hypermetabolic activity in the lower extremities.  IMPRESSION: 1. No hypermetabolic osseous myeloma. 2. Mildly hypermetabolic right hilar lymph node, mildly increased in metabolism since 01/30/2016. Newly mildly hypermetabolic nonenlarged right lower paratracheal mediastinal lymph node. No acute pulmonary disease. These nodes are nonspecific and warrant attention on follow-up chest CT with IV contrast versus follow-up PET-CT. 3. Mildly enlarged prostate. New nonspecific mild focal hypermetabolism in the left  prostate peripheral zone, without associated discrete mass on the CT images. Recommend correlation with serum PSA. 4. Aortic atherosclerosis.   Electronically Signed   By: Ilona Sorrel M.D.   On: 10/29/2016 14:22     PET scan (outside records) showed:  1. Left adrenal hypermetabolism without well-defined CT correlate; felt to be physiologic. 2. No evidence of bone metastatic process.     ASSESSMENT/PLAN: Smoldering Myeloma Iron deficiency Anemia- Received a dose of feraheme 510 mg on 11/26/16. Chronic anticoagulation Hemoccult positive stool Iron deficiency History LLE DVT  PLAN: - Doing well. Clinically stable in terms of his smoldering myeloma. Will follow  up on his myeloma labs for today. No evidence of cytopenias on CBC.  - RTC in 3 months with labs.   Orders Placed This Encounter  Procedures  . CBC with Differential    Standing Status:   Future    Standing Expiration Date:   01/31/2018  . Comprehensive metabolic panel    Standing Status:   Future    Standing Expiration Date:   01/31/2018  . Kappa/lambda light chains    Standing Status:   Future    Standing Expiration Date:   01/31/2018  . Beta 2 microglobuline, serum    Standing Status:   Future    Standing Expiration Date:   01/31/2018  . Protein electrophoresis, serum    Standing Status:   Future    Standing Expiration Date:   01/31/2018  . Iron and TIBC    Standing Status:   Future    Standing Expiration Date:   01/31/2018  . Ferritin    Standing Status:   Future    Standing Expiration Date:   01/31/2018    All questions were answered.   This document serves as a record of services personally performed by Twana First, MD. It was created on her behalf by Maryla Morrow, a trained medical scribe. The creation of this record is based on the scribe's personal observations and the provider's statements to them. This document has been checked and approved by the attending provider.  I have reviewed the above  documentation for accuracy and completeness and I agree with the above.  This note was electronically signed.  Twana First, MD 01/30/2017

## 2017-01-31 NOTE — Patient Instructions (Signed)
[place] at [facility] Discharge Instructions  RECOMMENDATIONS MADE BY THE CONSULTANT AND ANY TEST RESULTS WILL BE SENT TO YOUR REFERRING PHYSICIAN.  Return in 3 months with labs  Thank you for choosing [place] at [facility] to provide your oncology and hematology care.  To afford each patient quality time with our provider, please arrive at least 15 minutes before your scheduled appointment time.    If you have a lab appointment with the [place] please come in thru the  Main Entrance and check in at the main information desk  You need to re-schedule your appointment should you arrive 10 or more minutes late.  We strive to give you quality time with our providers, and arriving late affects you and other patients whose appointments are after yours.  Also, if you no show three or more times for appointments you may be dismissed from the clinic at the providers discretion.     Again, thank you for choosing [facility].  Our hope is that these requests will decrease the amount of time that you wait before being seen by our physicians.       _____________________________________________________________  Should you have questions after your visit to [facility], please contact our office at [phone] between the hours of 8:30 a.m. and 4:30 p.m.  Voicemails left after 4:30 p.m. will not be returned until the following business day.  For prescription refill requests, have your pharmacy contact our office.       Resources For Cancer Patients and their Caregivers ? American Cancer Society: Can assist with transportation, wigs, general needs, runs Look Good Feel Better.        260-877-5623 ? Cancer Care: Provides financial assistance, online support groups, medication/co-pay assistance.  1-800-813-HOPE (713)543-7639) ? [place] Assists [place] cancer patients and their families  through emotional , educational and financial support.  202-051-3767 ? [place] DSS Where to apply for food stamps, Medicaid and utility assistance. 519-098-7238 ? [employer]: Transportation to medical appointments. 250 102 7122 ? Social Security Administration: May apply for disability if have a Stage IV cancer. (801) 768-6558 734-113-6385 ? [employer], Disability and Transit Services: Assists with nutrition, care and transit needs. [place] Support Programs: @10RELATIVEDAYS @ > Cancer Support Group  2nd Tuesday of the month 1pm-2pm, Journey Room  > Creative Journey  3rd Tuesday of the month 1130am-1pm, Journey Room  > Look Good Feel Better  1st Wednesday of the month 10am-12 noon, Journey Room (Call [place] to register [phone])

## 2017-02-01 LAB — PROTEIN ELECTROPHORESIS, SERUM
A/G RATIO SPE: 0.6 — AB (ref 0.7–1.7)
ALBUMIN ELP: 3.5 g/dL (ref 2.9–4.4)
ALPHA-2-GLOBULIN: 0.6 g/dL (ref 0.4–1.0)
Alpha-1-Globulin: 0.2 g/dL (ref 0.0–0.4)
BETA GLOBULIN: 0.9 g/dL (ref 0.7–1.3)
GAMMA GLOBULIN: 4.2 g/dL — AB (ref 0.4–1.8)
Globulin, Total: 5.9 g/dL — ABNORMAL HIGH (ref 2.2–3.9)
M-Spike, %: 3.8 g/dL — ABNORMAL HIGH
Total Protein ELP: 9.4 g/dL — ABNORMAL HIGH (ref 6.0–8.5)

## 2017-02-01 LAB — KAPPA/LAMBDA LIGHT CHAINS
KAPPA, LAMDA LIGHT CHAIN RATIO: 7.42 — AB (ref 0.26–1.65)
Kappa free light chain: 107.6 mg/L — ABNORMAL HIGH (ref 3.3–19.4)
LAMDA FREE LIGHT CHAINS: 14.5 mg/L (ref 5.7–26.3)

## 2017-02-02 LAB — IMMUNOFIXATION ELECTROPHORESIS
IGM, SERUM: 15 mg/dL (ref 15–143)
IgA: <5 mg/dL — ABNORMAL LOW (ref 61–437)
IgG (Immunoglobin G), Serum: 4654 mg/dL — ABNORMAL HIGH (ref 700–1600)
Total Protein ELP: 9.4 g/dL — ABNORMAL HIGH (ref 6.0–8.5)

## 2017-05-03 ENCOUNTER — Encounter (HOSPITAL_COMMUNITY): Payer: Medicare FFS | Attending: Oncology

## 2017-05-03 ENCOUNTER — Encounter (HOSPITAL_COMMUNITY): Payer: Self-pay

## 2017-05-03 ENCOUNTER — Encounter (HOSPITAL_BASED_OUTPATIENT_CLINIC_OR_DEPARTMENT_OTHER): Payer: Medicare FFS | Admitting: Oncology

## 2017-05-03 VITALS — BP 138/72 | HR 57 | Temp 98.0°F | Resp 16 | Wt 184.9 lb

## 2017-05-03 DIAGNOSIS — C9000 Multiple myeloma not having achieved remission: Secondary | ICD-10-CM | POA: Diagnosis present

## 2017-05-03 DIAGNOSIS — Z7901 Long term (current) use of anticoagulants: Secondary | ICD-10-CM | POA: Diagnosis not present

## 2017-05-03 DIAGNOSIS — D509 Iron deficiency anemia, unspecified: Secondary | ICD-10-CM | POA: Diagnosis not present

## 2017-05-03 DIAGNOSIS — D638 Anemia in other chronic diseases classified elsewhere: Secondary | ICD-10-CM | POA: Diagnosis present

## 2017-05-03 LAB — COMPREHENSIVE METABOLIC PANEL
ALBUMIN: 3.3 g/dL — AB (ref 3.5–5.0)
ALK PHOS: 43 U/L (ref 38–126)
ALT: 12 U/L — AB (ref 17–63)
AST: 26 U/L (ref 15–41)
Anion gap: 0 — ABNORMAL LOW (ref 5–15)
BUN: 14 mg/dL (ref 6–20)
CALCIUM: 8.7 mg/dL — AB (ref 8.9–10.3)
CHLORIDE: 107 mmol/L (ref 101–111)
CO2: 26 mmol/L (ref 22–32)
CREATININE: 1.17 mg/dL (ref 0.61–1.24)
GFR calc Af Amer: >60 mL/min (ref >60)
GFR calc non Af Amer: 55 mL/min — ABNORMAL LOW (ref >60)
GLUCOSE: 83 mg/dL (ref 65–99)
Potassium: 3.7 mmol/L (ref 3.5–5.1)
SODIUM: 133 mmol/L — AB (ref 135–145)
Total Bilirubin: 0.9 mg/dL (ref 0.3–1.2)
Total Protein: 9.6 g/dL — ABNORMAL HIGH (ref 6.5–8.1)

## 2017-05-03 LAB — CBC WITH DIFFERENTIAL/PLATELET
BASOS ABS: 0 10*3/uL (ref 0.0–0.1)
Basophils Relative: 0 %
Eosinophils Absolute: 0.1 10*3/uL (ref 0.0–0.7)
Eosinophils Relative: 3 %
HEMATOCRIT: 35.9 % — AB (ref 39.0–52.0)
HEMOGLOBIN: 11.9 g/dL — AB (ref 13.0–17.0)
Lymphocytes Relative: 46 %
Lymphs Abs: 1.8 10*3/uL (ref 0.7–4.0)
MCH: 32.7 pg (ref 26.0–34.0)
MCHC: 33.1 g/dL (ref 30.0–36.0)
MCV: 98.6 fL (ref 78.0–100.0)
MONOS PCT: 13 %
Monocytes Absolute: 0.5 10*3/uL (ref 0.1–1.0)
NEUTROS ABS: 1.4 10*3/uL — AB (ref 1.7–7.7)
NEUTROS PCT: 38 %
Platelets: 156 10*3/uL (ref 150–400)
RBC: 3.64 MIL/uL — AB (ref 4.22–5.81)
RDW: 15 % (ref 11.5–15.5)
WBC: 3.8 10*3/uL — AB (ref 4.0–10.5)

## 2017-05-03 LAB — FERRITIN: FERRITIN: 40 ng/mL (ref 24–336)

## 2017-05-03 LAB — IRON AND TIBC
Iron: 77 ug/dL (ref 45–182)
SATURATION RATIOS: 28 % (ref 17.9–39.5)
TIBC: 274 ug/dL (ref 250–450)
UIBC: 197 ug/dL

## 2017-05-03 NOTE — Patient Instructions (Signed)
[place] at [facility] Discharge Instructions  RECOMMENDATIONS MADE BY THE CONSULTANT AND ANY TEST RESULTS WILL BE SENT TO YOUR REFERRING PHYSICIAN.  Exam with Dr. Talbert Cage today.    Thank you for choosing [place] at [facility] to provide your oncology and hematology care.  To afford each patient quality time with our provider, please arrive at least 15 minutes before your scheduled appointment time.    If you have a lab appointment with the [place] please come in thru the  Main Entrance and check in at the main information desk  You need to re-schedule your appointment should you arrive 10 or more minutes late.  We strive to give you quality time with our providers, and arriving late affects you and other patients whose appointments are after yours.  Also, if you no show three or more times for appointments you may be dismissed from the clinic at the providers discretion.     Again, thank you for choosing [facility].  Our hope is that these requests will decrease the amount of time that you wait before being seen by our physicians.       _____________________________________________________________  Should you have questions after your visit to [facility], please contact our office at [phone] between the hours of 8:30 a.m. and 4:30 p.m.  Voicemails left after 4:30 p.m. will not be returned until the following business day.  For prescription refill requests, have your pharmacy contact our office.       Resources For Cancer Patients and their Caregivers ? American Cancer Society: Can assist with transportation, wigs, general needs, runs Look Good Feel Better.        786-758-6462 ? Cancer Care: Provides financial assistance, online support groups, medication/co-pay assistance.  1-800-813-HOPE 848-033-3857) ? [place] Assists [place] cancer patients and their families  through emotional , educational and financial support.  (612)796-8736 ? [place] DSS Where to apply for food stamps, Medicaid and utility assistance. (320)587-8551 ? [employer]: Transportation to medical appointments. 320-253-4500 ? Social Security Administration: May apply for disability if have a Stage IV cancer. 402-781-7257 (662)417-7075 ? [employer], Disability and Transit Services: Assists with nutrition, care and transit needs. [place] Support Programs: @10RELATIVEDAYS @ > Cancer Support Group  2nd Tuesday of the month 1pm-2pm, Journey Room  > Creative Journey  3rd Tuesday of the month 1130am-1pm, Journey Room  > Look Good Feel Better  1st Wednesday of the month 10am-12 noon, Journey Room (Call [place] to register [phone])

## 2017-05-03 NOTE — Progress Notes (Signed)
**Dale Dale** Dale Dale  [address] [place] [address]   CLINIC:  Medical Oncology  REASON FOR CONSULTATION:  Smoldering multiple myeloma Iron-deficiency anemia   REFERRAL FROM:  [facility]  Dale Dale  PRIMARY CARE PROVIDER: Dr. Stoney Suarez [phone]  BRIEF ONCOLOGY HISTORY:  Multiple Myeloma-Patient was referred to Dale Dale, further work-up suggested malignancy as possible etiology. In 12/2015-patient underwent bone marrow biopsy showing 13% myeloma cells, hypercellular bone marrow 40%, FISH IGH/14q+, and gain of Ch9 & CCNDI/11q; Amyloid stains (-). No bone pain, muscle weakness.  No treatment recommended; has been under surveillance since that time.  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  Received a dose of feraheme 510 mg on 11/26/16. Left LE DVT-Diagnosed about 7 years ago; on anti-coagulation with Coumadin 5 mg daily; managed by PCP.    INTERVAL HISTORY:  Dale Dale presents to the [facility] for routine follow up. Patient states that he has been doing well except a few weeks ago he was stretching in the morning and got a cramp in his leg. He states that he continued to have soreness in that leg so he went see his primary care physician who placed him on Neurontin. He denies any bone pain. The patient denies chest pain, shortness of breath, or abdominal pain. He reports a normal appetite.   ADDITIONAL REVIEW OF SYSTEMS: Review of Systems  Constitutional: Negative.        Normal appetite.  HENT: Negative.   Eyes: Negative.   Respiratory: Negative.  Negative for cough, shortness of breath and wheezing.   Cardiovascular: Negative.  Negative for chest pain.  Gastrointestinal: Negative.  Negative for abdominal pain.  Genitourinary: Negative.   Musculoskeletal: Negative.        Reports some left knee  discomfort.  Skin: Negative.   Neurological: Negative.   Endo/Heme/Allergies: Negative.   Psychiatric/Behavioral: Negative.   All other systems reviewed and are negative. 14 point review of systems was performed and is negative except as detailed under history of present illness and above   PAST MEDICAL & SURGICAL HISTORY:  Past Medical History:  Diagnosis Date  . Anemia   . Glaucoma   . Hypertension    History reviewed. No pertinent surgical history.   SOCIAL HISTORY:  Dale Dale is widowed and lives alone in [place], [place].  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  He has one daughter, Dale Dale, who is his Press photographer and is a Architect and works as the Personal assistant on the mother/baby unit at [employer]. He has 4 grandsons, ranging in ages from 21-35.  He enjoys watching TV, particularly old westerns.  He also enjoys walking, which he does every day for about 0.5 mile/day.    CURRENT MEDICATIONS:  Current Outpatient Prescriptions on File Prior to Visit  Medication Sig Dispense Refill  . albuterol (PROVENTIL HFA;VENTOLIN HFA) 108 (90 Base) MCG/ACT inhaler Inhale into the lungs.    Marland Kitchen aspirin EC 81 MG tablet Take 81 mg by mouth daily.    . brinzolamide (AZOPT) 1 % ophthalmic suspension Place 1 drop into both eyes 2 (two) times  daily.    . Cholecalciferol (VITAMIN D3) 2000 units TABS Take 2,000 Units by mouth daily.     . Cod Liver Oil OIL Take by mouth daily.     . cyanocobalamin 500 MCG tablet Take 500 mcg by mouth daily.    Marland Kitchen doxazosin (CARDURA) 8 MG tablet Take 8 mg by mouth daily.     . Glucosamine Sulfate 1000 MG CAPS Take by mouth. Takes twice a day    . hydrochlorothiazide (HYDRODIURIL) 50 MG tablet Take 50 mg by mouth daily.      Marland Kitchen latanoprost (XALATAN) 0.005 % ophthalmic solution Place 1 drop into both eyes at bedtime.    Marland Kitchen loratadine (CLARITIN) 10 MG tablet Take 10 mg by mouth daily.     . methocarbamol (ROBAXIN) 500 MG tablet Take 1 tablet (500 mg total) by mouth 3 (three) times daily. 60 tablet 1  . predniSONE (DELTASONE) 10 MG tablet Take 1 tablet (10 mg total) by mouth daily. 10 tablet 1  . Sildenafil Citrate (VIAGRA PO) Take by mouth.    . warfarin (COUMADIN) 5 MG tablet Take 5 mg by mouth one time only at 6 PM.      No current facility-administered medications on file prior to visit.      ALLERGIES: No Known Allergies  PHYSICAL EXAM:  Vitals:   05/03/17 1325  BP: 138/72  Pulse: (!) 57  Resp: 16  Temp: 98 F (36.7 C)   Filed Weights   05/03/17 1325  Weight: 184 lb 14.4 oz (83.9 kg)     Physical Exam  Constitutional: He is oriented to person, place, and time and well-developed, well-nourished, and in no distress.  Pt was able to get on exam table without assistance.  Wears glasses.  HENT:  Head: Normocephalic and atraumatic.  Mouth/Throat: Oropharynx is clear and moist.  Eyes: Conjunctivae and EOM are normal. Pupils are equal, round, and reactive to light. No scleral icterus.  Neck: Normal range of motion. Neck supple.  Cardiovascular: Normal rate, regular rhythm and normal heart sounds.  Exam reveals no gallop and no friction rub.   No murmur heard. Pulmonary/Chest: Effort normal and breath sounds normal. No respiratory distress. He has no wheezes. He has no rales.  Abdominal: Soft. Bowel sounds are normal. He exhibits no distension and no mass. There is no tenderness. There is no rebound and no guarding.  Musculoskeletal: Normal range of motion.  Lymphadenopathy:    He has no cervical adenopathy.  Neurological: He is alert and oriented to person, place, and time. Gait normal.  Skin: Skin is warm and dry.  Psychiatric: Mood, memory, affect and judgment normal.  Nursing Dale and vitals  reviewed.    LABORATORY DATA: I have reviewed the laboratory data below:  CBC    Component Value Date/Time   WBC 3.8 (L) 05/03/2017 1220   RBC 3.64 (L) 05/03/2017 1220   HGB 11.9 (L) 05/03/2017 1220   HCT 35.9 (L) 05/03/2017 1220   PLT 156 05/03/2017 1220   MCV 98.6 05/03/2017 1220   MCH 32.7 05/03/2017 1220   MCHC 33.1 05/03/2017 1220   RDW 15.0 05/03/2017 1220   LYMPHSABS 1.8 05/03/2017 1220   MONOABS 0.5 05/03/2017 1220   EOSABS 0.1 05/03/2017 1220   BASOSABS 0.0 05/03/2017 1220   CMP Latest Ref Rng & Units 05/03/2017 01/31/2017 11/01/2016  Glucose 65 - 99 mg/dL 83 74 74  BUN 6 - 20 mg/dL 14 15 15   Creatinine 0.61 - 1.24 mg/dL 1.17 1.14 1.22  Sodium 135 - 145 mmol/L 133(L) 134(L) 132(L)  Potassium 3.5 - 5.1 mmol/L 3.7 3.8 3.8  Chloride 101 - 111 mmol/L 107 105 102  CO2 22 - 32 mmol/L 26 27 26   Calcium 8.9 - 10.3 mg/dL 8.7(L) 8.8(L) 8.7(L)  Total Protein 6.5 - 8.1 g/dL 9.6(H) 9.6(H) 9.6(H)  Total Bilirubin 0.3 - 1.2 mg/dL 0.9 0.4 0.6  Alkaline Phos 38 - 126 U/L 43 44 45  AST 15 - 41 U/L 26 29 32  ALT 17 - 63 U/L 12(L) 13(L) 14(L)    DIAGNOSTIC IMAGING:  I have personally reviewed the radiological images as listed and agreed with the findings in the report.  Study Result   CLINICAL DATA:  Subsequent treatment strategy for smoldering myeloma. Restaging.  EXAM: NUCLEAR MEDICINE PET WHOLE BODY  TECHNIQUE: 9.8 mCi F-18 FDG was injected intravenously. Full-ring PET imaging was performed from the vertex to the feet after the radiotracer. CT data was obtained and used for attenuation correction and anatomic localization.  FASTING BLOOD GLUCOSE:  Value: 94 mg/dl  COMPARISON:  01/30/2016 PET-CT.  FINDINGS: HEAD/NECK  No hypermetabolic activity in the scalp. No hypermetabolic cervical lymph nodes. Symmetric hypermetabolism in the glottis without CT correlate, probably physiologic.  CHEST  Mildly atherosclerotic nonaneurysmal thoracic aorta.  No hypermetabolic axillary lymphadenopathy.  Newly mildly hypermetabolic nonenlarged 0.7 cm right lower paratracheal node with max SUV 3.8 (series 3/ image 102), stable in size.  Mildly hypermetabolic right hilar lymph node with max SUV 4.7, poorly delineated on the noncontrast CT images, previous max SUV 3.6, mildly increased.  No additional hypermetabolic mediastinal or hilar lymph nodes. No significant pleural effusions (trace layering pleural fluid bilaterally is stable and probably physiologic). No acute consolidative airspace disease, lung masses or significant pulmonary nodules.  ABDOMEN/PELVIS  No abnormal hypermetabolic activity within the liver, pancreas, adrenal glands, or spleen. No hypermetabolic lymph nodes in the abdomen or pelvis. Simple 2.2 cm left liver lobe cyst. Additional subcentimeter hypodense liver lesions are too small to characterize and stable, suggesting benign lesions. Simple 4.0 cm renal cyst in the lateral interpolar left kidney. Atherosclerotic nonaneurysmal abdominal aorta.  Mildly enlarged prostate. Nonspecific new mild focal hypermetabolism in the left prostate peripheral zone with max SUV 5.9 without associated discrete mass on the CT images.  SKELETON  No skeletal foci of hypermetabolism.  EXTREMITIES  No abnormal hypermetabolic activity in the lower extremities.  IMPRESSION: 1. No hypermetabolic osseous myeloma. 2. Mildly hypermetabolic right hilar lymph node, mildly increased in metabolism since 01/30/2016. Newly mildly hypermetabolic nonenlarged right lower paratracheal mediastinal lymph node. No acute pulmonary disease. These nodes are nonspecific and warrant attention on follow-up chest CT with IV contrast versus follow-up PET-CT. 3. Mildly enlarged prostate. New nonspecific mild focal hypermetabolism in the left prostate peripheral zone, without associated discrete mass on the CT images. Recommend correlation with  serum PSA. 4. Aortic atherosclerosis.   Electronically Signed   By: Ilona Sorrel M.D.   On: 10/29/2016 14:22     PET scan (outside records) showed:  1. Left adrenal hypermetabolism without well-defined CT correlate; felt to be physiologic. 2. No evidence of bone metastatic process.     ASSESSMENT/PLAN: Smoldering Myeloma Iron deficiency Anemia- Received a dose of feraheme 510 mg on 11/26/16. Chronic anticoagulation Hemoccult positive stool Iron deficiency History LLE DVT  PLAN: -  Clinically stable in terms of his smoldering myeloma. His M spike has been stable at 3.8 g/dL for the past 6 months, will follow up on his SPEP from  today. Renal function stable. No new bone pain. Mild anemia and neutropenia are stable.   - RTC in 3 months with labs.   Orders Placed This Encounter  Procedures  . CBC with Differential    Standing Status:   Future    Standing Expiration Date:   05/03/2018  . Comprehensive metabolic panel    Standing Status:   Future    Standing Expiration Date:   05/03/2018  . Beta 2 microglobulin, serum    Standing Status:   Future    Standing Expiration Date:   05/03/2018  . Protein electrophoresis, serum    Standing Status:   Future    Standing Expiration Date:   05/03/2018  . Kappa/lambda light chains    Standing Status:   Future    Standing Expiration Date:   05/03/2018    All questions were answered.   This document serves as a record of services personally performed by Twana First, MD. It was created on her behalf by Maryla Morrow, a trained medical scribe. The creation of this record is based on the scribe's personal observations and the provider's statements to them. This document has been checked and approved by the attending provider.  I have reviewed the above documentation for accuracy and completeness and I agree with the above.  This Dale was electronically signed.  Twana First, MD 01/30/2017

## 2017-05-04 LAB — PROTEIN ELECTROPHORESIS, SERUM
A/G RATIO SPE: 0.6 — AB (ref 0.7–1.7)
ALBUMIN ELP: 3.6 g/dL (ref 2.9–4.4)
ALPHA-1-GLOBULIN: 0.2 g/dL (ref 0.0–0.4)
Alpha-2-Globulin: 0.6 g/dL (ref 0.4–1.0)
Beta Globulin: 0.9 g/dL (ref 0.7–1.3)
Gamma Globulin: 4 g/dL — ABNORMAL HIGH (ref 0.4–1.8)
Globulin, Total: 5.8 g/dL — ABNORMAL HIGH (ref 2.2–3.9)
M-Spike, %: 3.8 g/dL — ABNORMAL HIGH
TOTAL PROTEIN ELP: 9.4 g/dL — AB (ref 6.0–8.5)

## 2017-05-04 LAB — KAPPA/LAMBDA LIGHT CHAINS
Kappa free light chain: 124.4 mg/L — ABNORMAL HIGH (ref 3.3–19.4)
Kappa, lambda light chain ratio: 8.89 — ABNORMAL HIGH (ref 0.26–1.65)
Lambda free light chains: 14 mg/L (ref 5.7–26.3)

## 2017-05-04 LAB — BETA 2 MICROGLOBULIN, SERUM: BETA 2 MICROGLOBULIN: 2.8 mg/L — AB (ref 0.6–2.4)

## 2017-09-06 ENCOUNTER — Other Ambulatory Visit: Payer: Self-pay

## 2017-09-06 ENCOUNTER — Encounter (HOSPITAL_COMMUNITY): Payer: Medicare FFS | Attending: Oncology

## 2017-09-06 ENCOUNTER — Encounter (HOSPITAL_COMMUNITY): Payer: Self-pay | Admitting: Oncology

## 2017-09-06 ENCOUNTER — Encounter (HOSPITAL_COMMUNITY): Payer: Medicare FFS | Admitting: Oncology

## 2017-09-06 VITALS — BP 127/79 | HR 60 | Temp 98.3°F | Resp 18 | Ht 71.0 in | Wt 186.4 lb

## 2017-09-06 DIAGNOSIS — K921 Melena: Secondary | ICD-10-CM

## 2017-09-06 DIAGNOSIS — D709 Neutropenia, unspecified: Secondary | ICD-10-CM | POA: Diagnosis not present

## 2017-09-06 DIAGNOSIS — Z86718 Personal history of other venous thrombosis and embolism: Secondary | ICD-10-CM | POA: Diagnosis not present

## 2017-09-06 DIAGNOSIS — C9000 Multiple myeloma not having achieved remission: Secondary | ICD-10-CM | POA: Diagnosis not present

## 2017-09-06 DIAGNOSIS — Z7901 Long term (current) use of anticoagulants: Secondary | ICD-10-CM | POA: Diagnosis not present

## 2017-09-06 DIAGNOSIS — D509 Iron deficiency anemia, unspecified: Secondary | ICD-10-CM

## 2017-09-06 LAB — CBC WITH DIFFERENTIAL/PLATELET
BASOS PCT: 0 %
Basophils Absolute: 0 10*3/uL (ref 0.0–0.1)
EOS ABS: 0.1 10*3/uL (ref 0.0–0.7)
EOS PCT: 2 %
HCT: 37.6 % — ABNORMAL LOW (ref 39.0–52.0)
Hemoglobin: 12.5 g/dL — ABNORMAL LOW (ref 13.0–17.0)
LYMPHS ABS: 1.6 10*3/uL (ref 0.7–4.0)
Lymphocytes Relative: 46 %
MCH: 33 pg (ref 26.0–34.0)
MCHC: 33.2 g/dL (ref 30.0–36.0)
MCV: 99.2 fL (ref 78.0–100.0)
Monocytes Absolute: 0.3 10*3/uL (ref 0.1–1.0)
Monocytes Relative: 9 %
NEUTROS PCT: 43 %
Neutro Abs: 1.5 10*3/uL — ABNORMAL LOW (ref 1.7–7.7)
PLATELETS: 163 10*3/uL (ref 150–400)
RBC: 3.79 MIL/uL — AB (ref 4.22–5.81)
RDW: 14.8 % (ref 11.5–15.5)
WBC: 3.5 10*3/uL — AB (ref 4.0–10.5)

## 2017-09-06 LAB — COMPREHENSIVE METABOLIC PANEL
ALBUMIN: 3.3 g/dL — AB (ref 3.5–5.0)
ALT: 14 U/L — AB (ref 17–63)
AST: 29 U/L (ref 15–41)
Alkaline Phosphatase: 46 U/L (ref 38–126)
BUN: 15 mg/dL (ref 6–20)
CO2: 27 mmol/L (ref 22–32)
Calcium: 8.8 mg/dL — ABNORMAL LOW (ref 8.9–10.3)
Chloride: 103 mmol/L (ref 101–111)
Creatinine, Ser: 1.12 mg/dL (ref 0.61–1.24)
GFR calc non Af Amer: 58 mL/min — ABNORMAL LOW (ref >60)
GLUCOSE: 90 mg/dL (ref 65–99)
POTASSIUM: 3.9 mmol/L (ref 3.5–5.1)
SODIUM: 131 mmol/L — AB (ref 135–145)
Total Bilirubin: 0.7 mg/dL (ref 0.3–1.2)
Total Protein: 9.6 g/dL — ABNORMAL HIGH (ref 6.5–8.1)

## 2017-09-06 NOTE — [facility] Notes (Signed)
[facility] PROGRESS NOTE  [address] [place] [address]   CLINIC:  Medical Oncology  REASON FOR CONSULTATION:  Smoldering multiple myeloma Iron-deficiency anemia   REFERRAL FROM:  [facility]  Dr. Audree Camel  PRIMARY CARE PROVIDER: Dr. Stoney Bang [phone]  BRIEF ONCOLOGY HISTORY:  Multiple Myeloma-Patient was referred to Dr. Jacquiline Doe, further work-up suggested malignancy as possible etiology. In 12/2015-patient underwent bone marrow biopsy showing 13% myeloma cells, hypercellular bone marrow 40%, FISH IGH/14q+, and gain of Ch9 & CCNDI/11q; Amyloid stains (-). No bone pain, muscle weakness.  No treatment recommended; has been under surveillance since that time.  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  Received a dose of feraheme 510 mg on 11/26/16. Left LE DVT-Diagnosed about 7 years ago; on anti-coagulation with Coumadin 5 mg daily; managed by PCP.    INTERVAL HISTORY:  Mr. Hammerschmidt presents to the [facility] for routine follow up. He states he has been doing well. He had some leg swelling a few weeks ago and his PCP told him to wear compression stockings. He has been wearing them and his leg swelling has gone down. He denies any bone pain. The patient denies chest pain, shortness of breath, or abdominal pain. He reports a normal appetite.   ADDITIONAL REVIEW OF SYSTEMS: Review of Systems  Constitutional: Negative.        Normal appetite.  HENT: Negative.   Eyes: Negative.   Respiratory: Negative.  Negative for cough, shortness of breath and wheezing.   Cardiovascular: Negative.  Negative for chest pain.  Gastrointestinal: Negative.  Negative for abdominal pain.  Genitourinary: Negative.   Musculoskeletal: Negative.   Skin: Negative.   Neurological: Negative.   Endo/Heme/Allergies: Negative.   Psychiatric/Behavioral:  Negative.   All other systems reviewed and are negative. 14 point review of systems was performed and is negative except as detailed under history of present illness and above   PAST MEDICAL & SURGICAL HISTORY:  Past Medical History:  Diagnosis Date  . Anemia   . Glaucoma   . Hypertension    History reviewed. No pertinent surgical history.   SOCIAL HISTORY:  Mr. Kalman is widowed and lives alone in [place], [place].  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  He has one daughter, [name], who is his Press photographer and is a Architect and works as the Personal assistant on the mother/baby unit at [employer]. He has 4 grandsons, ranging in ages from 21-35.  He enjoys watching TV, particularly old westerns.  He also enjoys walking, which he does every day for about 0.5 mile/day.    CURRENT MEDICATIONS:  Current Outpatient Medications on File Prior to Visit  Medication Sig Dispense Refill  . albuterol (PROVENTIL HFA;VENTOLIN HFA) 108 (90 Base) MCG/ACT inhaler Inhale into the lungs.    Marland Kitchen aspirin EC 81 MG tablet Take 81 mg by mouth daily.    . brinzolamide (AZOPT) 1 % ophthalmic suspension Place 1 drop into both eyes 2 (two) times daily.    . Cholecalciferol (VITAMIN D3) 2000 units TABS Take 2,000 Units by mouth daily.     Jesus Genera Liver  Oil OIL Take by mouth daily.     . cyanocobalamin 500 MCG tablet Take 500 mcg by mouth daily.    Marland Kitchen doxazosin (CARDURA) 8 MG tablet Take 8 mg by mouth daily.     . Ferrous Gluconate (IRON 27 PO) Take 1 tablet every morning by mouth.    . Glucosamine Sulfate 1000 MG CAPS Take by mouth. Takes twice a day    . hydrochlorothiazide (HYDRODIURIL) 50 MG tablet Take 50 mg by mouth daily.     Marland Kitchen latanoprost (XALATAN) 0.005 % ophthalmic  solution Place 1 drop into both eyes at bedtime.    Marland Kitchen loratadine (CLARITIN) 10 MG tablet Take 10 mg by mouth daily.     . Sildenafil Citrate (VIAGRA PO) Take by mouth.    . warfarin (COUMADIN) 5 MG tablet Take 5 mg by mouth one time only at 6 PM.      No current facility-administered medications on file prior to visit.      ALLERGIES: No Known Allergies  PHYSICAL EXAM:  Vitals:   09/06/17 1310  BP: 127/79  Pulse: 60  Resp: 18  Temp: 98.3 F (36.8 C)  SpO2: 99%   Filed Weights   09/06/17 1310  Weight: 186 lb 6.4 oz (84.6 kg)     Physical Exam  Constitutional: He is oriented to person, place, and time and well-developed, well-nourished, and in no distress.  Pt was able to get on exam table without assistance.  Wears glasses.  HENT:  Head: Normocephalic and atraumatic.  Mouth/Throat: Oropharynx is clear and moist.  Eyes: Conjunctivae and EOM are normal. Pupils are equal, round, and reactive to light. No scleral icterus.  Neck: Normal range of motion. Neck supple.  Cardiovascular: Normal rate, regular rhythm and normal heart sounds. Exam reveals no gallop and no friction rub.  No murmur heard. Pulmonary/Chest: Effort normal and breath sounds normal. No respiratory distress. He has no wheezes. He has no rales.  Abdominal: Soft. Bowel sounds are normal. He exhibits no distension and no mass. There is no tenderness. There is no rebound and no guarding.  Musculoskeletal: Normal range of motion.  Lymphadenopathy:    He has no cervical adenopathy.  Neurological: He is alert and oriented to person, place, and time. Gait normal.  Skin: Skin is warm and dry.  Psychiatric: Mood, memory, affect and judgment normal.  Nursing note and vitals reviewed.    LABORATORY DATA: I have reviewed the laboratory data below:  CBC    Component Value Date/Time   WBC 3.5 (L) 09/06/2017 1132   RBC 3.79 (L) 09/06/2017 1132   HGB 12.5 (L) 09/06/2017 1132   HCT 37.6 (L) 09/06/2017 1132   PLT  163 09/06/2017 1132   MCV 99.2 09/06/2017 1132   MCH 33.0 09/06/2017 1132   MCHC 33.2 09/06/2017 1132   RDW 14.8 09/06/2017 1132   LYMPHSABS 1.6 09/06/2017 1132   MONOABS 0.3 09/06/2017 1132   EOSABS 0.1 09/06/2017 1132   BASOSABS 0.0 09/06/2017 1132   CMP Latest Ref Rng & Units 09/06/2017 05/03/2017 01/31/2017  Glucose 65 - 99 mg/dL 90 83 74  BUN 6 - 20 mg/dL 15 14 15   Creatinine 0.61 - 1.24 mg/dL 1.12 1.17 1.14  Sodium 135 - 145 mmol/L 131(L) 133(L) 134(L)  Potassium 3.5 - 5.1 mmol/L 3.9 3.7 3.8  Chloride 101 - 111 mmol/L 103 107 105  CO2 22 - 32 mmol/L 27 26 27   Calcium 8.9 - 10.3 mg/dL 8.8(L) 8.7(L) 8.8(L)  Total Protein 6.5 -  8.1 g/dL 9.6(H) 9.6(H) 9.6(H)  Total Bilirubin 0.3 - 1.2 mg/dL 0.7 0.9 0.4  Alkaline Phos 38 - 126 U/L 46 43 44  AST 15 - 41 U/L 29 26 29   ALT 17 - 63 U/L 14(L) 12(L) 13(L)    DIAGNOSTIC IMAGING:  I have personally reviewed the radiological images as listed and agreed with the findings in the report.  Study Result   CLINICAL DATA:  Subsequent treatment strategy for smoldering myeloma. Restaging.  EXAM: NUCLEAR MEDICINE PET WHOLE BODY  TECHNIQUE: 9.8 mCi F-18 FDG was injected intravenously. Full-ring PET imaging was performed from the vertex to the feet after the radiotracer. CT data was obtained and used for attenuation correction and anatomic localization.  FASTING BLOOD GLUCOSE:  Value: 94 mg/dl  COMPARISON:  01/30/2016 PET-CT.  FINDINGS: HEAD/NECK  No hypermetabolic activity in the scalp. No hypermetabolic cervical lymph nodes. Symmetric hypermetabolism in the glottis without CT correlate, probably physiologic.  CHEST  Mildly atherosclerotic nonaneurysmal thoracic aorta. No hypermetabolic axillary lymphadenopathy.  Newly mildly hypermetabolic nonenlarged 0.7 cm right lower paratracheal node with max SUV 3.8 (series 3/ image 102), stable in size.  Mildly hypermetabolic right hilar lymph node with max SUV 4.7, poorly  delineated on the noncontrast CT images, previous max SUV 3.6, mildly increased.  No additional hypermetabolic mediastinal or hilar lymph nodes. No significant pleural effusions (trace layering pleural fluid bilaterally is stable and probably physiologic). No acute consolidative airspace disease, lung masses or significant pulmonary nodules.  ABDOMEN/PELVIS  No abnormal hypermetabolic activity within the liver, pancreas, adrenal glands, or spleen. No hypermetabolic lymph nodes in the abdomen or pelvis. Simple 2.2 cm left liver lobe cyst. Additional subcentimeter hypodense liver lesions are too small to characterize and stable, suggesting benign lesions. Simple 4.0 cm renal cyst in the lateral interpolar left kidney. Atherosclerotic nonaneurysmal abdominal aorta.  Mildly enlarged prostate. Nonspecific new mild focal hypermetabolism in the left prostate peripheral zone with max SUV 5.9 without associated discrete mass on the CT images.  SKELETON  No skeletal foci of hypermetabolism.  EXTREMITIES  No abnormal hypermetabolic activity in the lower extremities.  IMPRESSION: 1. No hypermetabolic osseous myeloma. 2. Mildly hypermetabolic right hilar lymph node, mildly increased in metabolism since 01/30/2016. Newly mildly hypermetabolic nonenlarged right lower paratracheal mediastinal lymph node. No acute pulmonary disease. These nodes are nonspecific and warrant attention on follow-up chest CT with IV contrast versus follow-up PET-CT. 3. Mildly enlarged prostate. New nonspecific mild focal hypermetabolism in the left prostate peripheral zone, without associated discrete mass on the CT images. Recommend correlation with serum PSA. 4. Aortic atherosclerosis.   Electronically Signed   By: Ilona Sorrel M.D.   On: 10/29/2016 14:22     PET scan (outside records) showed:  1. Left adrenal hypermetabolism without well-defined CT correlate; felt to be physiologic. 2.  No evidence of bone metastatic process.     ASSESSMENT/PLAN: Smoldering Myeloma Iron deficiency Anemia- Received a dose of feraheme 510 mg on 11/26/16. Chronic anticoagulation Hemoccult positive stool Iron deficiency History LLE DVT  PLAN: -  Clinically stable in terms of his smoldering myeloma. His M spike has been stable at 3.8 g/dL for the past 11 months, will follow up on his SPEP from today. Renal function stable. No new bone pain. Mild neutropenia are stable. No evidence of worsening anemia, hemoglobin 12.5 g/dL today. - RTC in 3 months with labs.   Orders Placed This Encounter  Procedures  . CBC with Differential    Standing Status:   Future  Standing Expiration Date:   09/06/2018  . Comprehensive metabolic panel    Standing Status:   Future    Standing Expiration Date:   09/06/2018  . Multiple Myeloma Panel (SPEP&IFE w/QIG)    Standing Status:   Future    Standing Expiration Date:   09/06/2018  . Kappa/lambda light chains    Standing Status:   Future    Standing Expiration Date:   09/06/2018    All questions were answered.   This note was electronically signed.  Twana First, MD 01/30/2017

## 2017-09-07 LAB — PROTEIN ELECTROPHORESIS, SERUM
A/G Ratio: 0.6 — ABNORMAL LOW (ref 0.7–1.7)
Albumin ELP: 3.8 g/dL (ref 2.9–4.4)
Alpha-1-Globulin: 0.2 g/dL (ref 0.0–0.4)
Alpha-2-Globulin: 0.6 g/dL (ref 0.4–1.0)
Beta Globulin: 0.9 g/dL (ref 0.7–1.3)
GLOBULIN, TOTAL: 5.9 g/dL — AB (ref 2.2–3.9)
Gamma Globulin: 4.2 g/dL — ABNORMAL HIGH (ref 0.4–1.8)
M-SPIKE, %: 4 g/dL — AB
TOTAL PROTEIN ELP: 9.7 g/dL — AB (ref 6.0–8.5)

## 2017-09-07 LAB — KAPPA/LAMBDA LIGHT CHAINS
Kappa free light chain: 139.5 mg/L — ABNORMAL HIGH (ref 3.3–19.4)
Kappa, lambda light chain ratio: 10.26 — ABNORMAL HIGH (ref 0.26–1.65)
LAMDA FREE LIGHT CHAINS: 13.6 mg/L (ref 5.7–26.3)

## 2017-09-07 LAB — BETA 2 MICROGLOBULIN, SERUM: Beta-2 Microglobulin: 2.4 mg/L (ref 0.6–2.4)

## 2017-11-30 ENCOUNTER — Inpatient Hospital Stay (HOSPITAL_COMMUNITY): Payer: Medicare FFS | Attending: Oncology

## 2017-11-30 DIAGNOSIS — C9000 Multiple myeloma not having achieved remission: Secondary | ICD-10-CM | POA: Diagnosis not present

## 2017-11-30 LAB — COMPREHENSIVE METABOLIC PANEL
ALBUMIN: 3.2 g/dL — AB (ref 3.5–5.0)
ALK PHOS: 45 U/L (ref 38–126)
ALT: 13 U/L — ABNORMAL LOW (ref 17–63)
ANION GAP: 4 — AB (ref 5–15)
AST: 30 U/L (ref 15–41)
BUN: 18 mg/dL (ref 6–20)
CO2: 25 mmol/L (ref 22–32)
Calcium: 8.9 mg/dL (ref 8.9–10.3)
Chloride: 105 mmol/L (ref 101–111)
Creatinine, Ser: 1.19 mg/dL (ref 0.61–1.24)
GFR calc Af Amer: >60 mL/min (ref >60)
GFR calc non Af Amer: 54 mL/min — ABNORMAL LOW (ref >60)
GLUCOSE: 98 mg/dL (ref 65–99)
POTASSIUM: 3.8 mmol/L (ref 3.5–5.1)
Sodium: 134 mmol/L — ABNORMAL LOW (ref 135–145)
Total Bilirubin: 0.6 mg/dL (ref 0.3–1.2)
Total Protein: 10 g/dL — ABNORMAL HIGH (ref 6.5–8.1)

## 2017-11-30 LAB — CBC WITH DIFFERENTIAL/PLATELET
BASOS PCT: 0 %
Basophils Absolute: 0 10*3/uL (ref 0.0–0.1)
EOS ABS: 0.1 10*3/uL (ref 0.0–0.7)
Eosinophils Relative: 2 %
HCT: 38.7 % — ABNORMAL LOW (ref 39.0–52.0)
HEMOGLOBIN: 11.9 g/dL — AB (ref 13.0–17.0)
Lymphocytes Relative: 39 %
Lymphs Abs: 1.6 10*3/uL (ref 0.7–4.0)
MCH: 31.1 pg (ref 26.0–34.0)
MCHC: 30.7 g/dL (ref 30.0–36.0)
MCV: 101 fL — ABNORMAL HIGH (ref 78.0–100.0)
MONOS PCT: 16 %
Monocytes Absolute: 0.7 10*3/uL (ref 0.1–1.0)
NEUTROS PCT: 43 %
Neutro Abs: 1.8 10*3/uL (ref 1.7–7.7)
Platelets: 169 10*3/uL (ref 150–400)
RBC: 3.83 MIL/uL — ABNORMAL LOW (ref 4.22–5.81)
RDW: 15.2 % (ref 11.5–15.5)
WBC: 4.1 10*3/uL (ref 4.0–10.5)

## 2017-12-01 LAB — MULTIPLE MYELOMA PANEL, SERUM: IgG (Immunoglobin G), Serum: UNDETERMINED mg/dL

## 2017-12-01 LAB — KAPPA/LAMBDA LIGHT CHAINS
KAPPA, LAMDA LIGHT CHAIN RATIO: 13.68 — AB (ref 0.26–1.65)
Kappa free light chain: 168.3 mg/L — ABNORMAL HIGH (ref 3.3–19.4)
Lambda free light chains: 12.3 mg/L (ref 5.7–26.3)

## 2017-12-05 ENCOUNTER — Telehealth (HOSPITAL_COMMUNITY): Payer: Self-pay | Admitting: *Deleted

## 2017-12-05 NOTE — Telephone Encounter (Signed)
Pt not available and no voicemail/ nor answering machine un able to leave a message. Will attempt to reach pt again about QNS myeloma panel and the need to come back in for redraw of this test.

## 2017-12-07 ENCOUNTER — Inpatient Hospital Stay (HOSPITAL_COMMUNITY): Payer: Medicare FFS

## 2017-12-07 ENCOUNTER — Other Ambulatory Visit (HOSPITAL_COMMUNITY): Payer: Self-pay

## 2017-12-07 ENCOUNTER — Ambulatory Visit (HOSPITAL_COMMUNITY): Payer: Medicare FFS | Admitting: Internal Medicine

## 2017-12-07 DIAGNOSIS — C9000 Multiple myeloma not having achieved remission: Secondary | ICD-10-CM

## 2017-12-12 LAB — MULTIPLE MYELOMA PANEL, SERUM
ALBUMIN/GLOB SERPL: 0.7 (ref 0.7–1.7)
ALPHA2 GLOB SERPL ELPH-MCNC: 0.6 g/dL (ref 0.4–1.0)
Albumin SerPl Elph-Mcnc: 3.5 g/dL (ref 2.9–4.4)
Alpha 1: 0.2 g/dL (ref 0.0–0.4)
B-Globulin SerPl Elph-Mcnc: 0.9 g/dL (ref 0.7–1.3)
Gamma Glob SerPl Elph-Mcnc: 4 g/dL — ABNORMAL HIGH (ref 0.4–1.8)
Globulin, Total: 5.7 g/dL — ABNORMAL HIGH (ref 2.2–3.9)
IGG (IMMUNOGLOBIN G), SERUM: 5657 mg/dL — AB (ref 700–1600)
IgA: <5 mg/dL — ABNORMAL LOW (ref 61–437)
IgM (Immunoglobulin M), Srm: 14 mg/dL — ABNORMAL LOW (ref 15–143)
M Protein SerPl Elph-Mcnc: 3.7 g/dL — ABNORMAL HIGH
TOTAL PROTEIN ELP: 9.2 g/dL — AB (ref 6.0–8.5)

## 2017-12-20 ENCOUNTER — Inpatient Hospital Stay (HOSPITAL_COMMUNITY): Payer: Medicare FFS | Admitting: Internal Medicine

## 2017-12-20 ENCOUNTER — Other Ambulatory Visit: Payer: Self-pay

## 2017-12-20 ENCOUNTER — Encounter (HOSPITAL_COMMUNITY): Payer: Self-pay | Admitting: Internal Medicine

## 2017-12-20 DIAGNOSIS — I10 Essential (primary) hypertension: Secondary | ICD-10-CM | POA: Diagnosis not present

## 2017-12-20 DIAGNOSIS — D649 Anemia, unspecified: Secondary | ICD-10-CM | POA: Diagnosis not present

## 2017-12-20 DIAGNOSIS — I82402 Acute embolism and thrombosis of unspecified deep veins of left lower extremity: Secondary | ICD-10-CM

## 2017-12-20 DIAGNOSIS — R590 Localized enlarged lymph nodes: Secondary | ICD-10-CM

## 2017-12-20 DIAGNOSIS — C9000 Multiple myeloma not having achieved remission: Secondary | ICD-10-CM | POA: Diagnosis not present

## 2017-12-20 DIAGNOSIS — Z7901 Long term (current) use of anticoagulants: Secondary | ICD-10-CM

## 2017-12-20 NOTE — Patient Instructions (Signed)
[place] at [facility] Discharge Instructions  RECOMMENDATIONS MADE BY THE CONSULTANT AND ANY TEST RESULTS WILL BE SENT TO YOUR REFERRING PHYSICIAN.  You were seen today by Dr. Zoila Shutter We will schedule the CT scan of your chest here at [facility] We will call you with the results Follow up in 4 months with labs  Thank you for choosing [place] at [facility] to provide your oncology and hematology care.  To afford each patient quality time with our provider, please arrive at least 15 minutes before your scheduled appointment time.    If you have a lab appointment with the [facility] please come in thru the  Main Entrance and check in at the main information desk  You need to re-schedule your appointment should you arrive 10 or more minutes late.  We strive to give you quality time with our providers, and arriving late affects you and other patients whose appointments are after yours.  Also, if you no show three or more times for appointments you may be dismissed from the clinic at the providers discretion.     Again, thank you for choosing [facility].  Our hope is that these requests will decrease the amount of time that you wait before being seen by our physicians.       _____________________________________________________________  Should you have questions after your visit to [facility], please contact our office at [phone] between the hours of 8:30 a.m. and 4:30 p.m.  Voicemails left after 4:30 p.m. will not be returned until the following business day.  For prescription refill requests, have your pharmacy contact our office.       Resources For Cancer Patients and their Caregivers ? American Cancer Society: Can assist with transportation, wigs, general needs, runs Look Good Feel Better.        807-599-1831 ? Cancer Care: Provides financial assistance, online support groups,  medication/co-pay assistance.  1-800-813-HOPE 859 605 1543) ? [place] Assists [place] cancer patients and their families through emotional , educational and financial support.  607-334-8084 ? [place] DSS Where to apply for food stamps, Medicaid and utility assistance. (231)690-6803 ? RCATS: Transportation to medical appointments. 661 770 9227 ? Social Security Administration: May apply for disability if have a Stage IV cancer. 216 364 4671 941-857-9509 ? [employer], Disability and Transit Services: Assists with nutrition, care and transit needs. [place] Support Programs: @10RELATIVEDAYS @ > Cancer Support Group  2nd Tuesday of the month 1pm-2pm, Journey Room  > Creative Journey  3rd Tuesday of the month 1130am-1pm, Journey Room  > Look Good Feel Better  1st Wednesday of the month 10am-12 noon, Journey Room (Call [place] to register [phone])

## 2018-01-02 NOTE — Progress Notes (Signed)
Diagnosis Localized enlarged lymph nodes - Plan: CBC with Differential/Platelet, Comprehensive metabolic panel, Lactate dehydrogenase, Protein electrophoresis, serum, IgG, IgA, IgM, Kappa/lambda light chains, CT Chest W Contrast  Staging Cancer Staging No matching staging information was found for the patient.  Assessment and Plan:  1.  MM.  Pt remains on observation.  Previously followed by Dr, Talbert Cage.  Recent M spike 3.7 g/dl. HB 11.5, Normal Cr and Calcium.  He had PET scan done 10/2016 that showed no bone lesions.  He will RTC in 4 months for repeat labs.    2.  Anemia.  HB 11.5.  Pt With history of IDA.  Will continue to monitor labs.    3.  Mediastinal adenopathy.  This was noted on PET scan done 10/2016.  He will be set up for repeat CT of chest.  Further recommendations pending scan results.    4.  Hypertension.  BP 129/72.  Continue to follow-up with PCP.    5.  Left LE DVT.  Pt remains on comadin and is followed by PCP for Monitoring.    Interval History:   Multiple Myeloma-Patient was referred to Dr. Jacquiline Doe, further work-up suggested malignancy as possible etiology. In 12/2015-patient underwent bone marrow biopsy showing 13% myeloma cells, hypercellular bone marrow 40%, FISH IGH/14q+, and gain of Ch9 & CCNDI/11q; Amyloid stains (-). No bone pain, muscle weakness.  No treatment recommended; has been under surveillance since that time.   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  Received a dose of feraheme 510 mg on 11/26/16.  Left LE DVT-Diagnosed about 7 years ago; on anti-coagulation with Coumadin 5 mg daily; managed by PCP.   Current Status:  Pt is seen today for follow- up.     Problem List Patient Active Problem List   Diagnosis Date Noted  . Vitamin B12 deficiency anemia due to intrinsic factor deficiency [D51.0] 03/10/2016  . Multiple myeloma (Dale Suarez) [C90.00] 02/05/2016  . History of  DVT (deep vein thrombosis) [Z86.718] 02/05/2016  . Anticoagulated on Coumadin [Z51.81, Z79.01] 02/05/2016  . Anemia of chronic disease [D63.8] 02/05/2016  . 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 Iron deficiency anemia [D50.9] 01/21/2016    Past Medical History Past Medical History:  Diagnosis Date  . Anemia   . Glaucoma   . Hypertension     Past Surgical History History reviewed. No pertinent surgical history.  Family History Family History  Problem Relation Age of Onset  . Hypertension Mother   . Heart attack Mother   . Heart attack Father   . Stroke Sister   . Alcoholism Brother   . Cirrhosis Brother      Social History  reports that  has never smoked. he has never used smokeless tobacco. He reports that he does not drink alcohol or use drugs.  Medications  Current Outpatient Medications:  .  albuterol (PROVENTIL HFA;VENTOLIN HFA) 108 (90 Base) MCG/ACT inhaler, Inhale into the lungs., Disp: , Rfl:  .  aspirin EC 81 MG tablet, Take 81 mg by mouth daily., Disp: , Rfl:  .  Cholecalciferol (VITAMIN D3) 2000 units TABS, Take 2,000 Units by mouth daily. , Disp: , Rfl:  .  Cod Liver Oil OIL, Take by mouth daily. , Disp: , Rfl:  .  cyanocobalamin 500 MCG tablet, Take 500 mcg by mouth daily., Disp: , Rfl:  .  dorzolamide (TRUSOPT) 2 % ophthalmic solution, INSTILL ONE DROP IN [facility] EYE TWICE DAILY, Disp: ,  Rfl: 2 .  doxazosin (CARDURA) 8 MG tablet, Take 8 mg by mouth daily. , Disp: , Rfl:  .  Ferrous Gluconate (IRON 27 PO), Take 1 tablet every morning by mouth., Disp: , Rfl:  .  Glucosamine Sulfate 1000 MG CAPS, Take by mouth. Takes twice a day, Disp: , Rfl:  .  hydrochlorothiazide (HYDRODIURIL) 50 MG tablet, Take 50 mg by mouth daily. , Disp: , Rfl:  .  latanoprost (XALATAN) 0.005 % ophthalmic solution, Place 1 drop into both eyes at bedtime., Disp: , Rfl:  .  loratadine (CLARITIN) 10 MG tablet, Take 10 mg by mouth daily. , Disp: , Rfl:  .  Sildenafil Citrate  (VIAGRA PO), Take by mouth., Disp: , Rfl:  .  warfarin (COUMADIN) 5 MG tablet, Take 5 mg by mouth one time only at 6 PM. , Disp: , Rfl:   Allergies Patient has no known allergies.  Review of Systems Review of Systems - Oncology ROS as per HPI otherwise 12 point ROS is negative.   Physical Exam  Vitals Wt Readings from Last 3 Encounters:  12/20/17 188 lb (85.3 kg)  09/06/17 186 lb 6.4 oz (84.6 kg)  05/03/17 184 lb 14.4 oz (83.9 kg)   Temp Readings from Last 3 Encounters:  12/20/17 98.4 F (36.9 C) (Oral)  09/06/17 98.3 F (36.8 C) (Oral)  05/03/17 98 F (36.7 C) (Oral)   BP Readings from Last 3 Encounters:  12/20/17 129/72  09/06/17 127/79  05/03/17 138/72   Pulse Readings from Last 3 Encounters:  12/20/17 73  09/06/17 60  05/03/17 (!) 57   Constitutional: Well-developed, well-nourished, and in no distress.   HENT: Head: Normocephalic and atraumatic.  Mouth/Throat: No oropharyngeal exudate. Mucosa moist. Eyes: Pupils are equal, round, and reactive to light. Conjunctivae are normal. No scleral icterus.  Neck: Normal range of motion. Neck supple. No JVD present.  Cardiovascular: Normal rate, regular rhythm and normal heart sounds.  Exam reveals no gallop and no friction rub.   No murmur heard. Pulmonary/Chest: Effort normal and breath sounds normal. No respiratory distress. No wheezes.No rales.  Abdominal: Soft. Bowel sounds are normal. No distension. There is no tenderness. There is no guarding.  Musculoskeletal: No edema or tenderness.  Lymphadenopathy: No cervical, axillary  or supraclavicular adenopathy.  Neurological: Alert and oriented to person, place, and time. No cranial nerve deficit.  Skin: Skin is warm and dry. No rash noted. No erythema. No pallor.  Psychiatric: Affect and judgment normal.   Labs No visits with results within 3 Day(s) from this visit.  Latest known visit with results is:  Orders Only on 12/07/2017  Component Date Value Ref Range  Status  . IgG (Immunoglobin G), Serum 12/07/2017 5,657* 700 - 1,600 mg/dL Final   Comment: (NOTE) Results confirmed on dilution.   . IgA 12/07/2017 <5* 61 - 437 mg/dL Final   Result confirmed on concentration.  . IgM (Immunoglobulin M), Srm 12/07/2017 14* 15 - 143 mg/dL Final   Result confirmed on concentration.  . Total Protein ELP 12/07/2017 9.2* 6.0 - 8.5 g/dL Corrected  . Albumin SerPl Elph-Mcnc 12/07/2017 3.5  2.9 - 4.4 g/dL Corrected  . Alpha 1 12/07/2017 0.2  0.0 - 0.4 g/dL Corrected  . Alpha2 Glob SerPl Elph-Mcnc 12/07/2017 0.6  0.4 - 1.0 g/dL Corrected  . B-Globulin SerPl Elph-Mcnc 12/07/2017 0.9  0.7 - 1.3 g/dL Corrected  . Gamma Glob SerPl Elph-Mcnc 12/07/2017 4.0* 0.4 - 1.8 g/dL Corrected  . M Protein SerPl Elph-Mcnc 12/07/2017 3.7*  Not Observed g/dL Corrected  . Globulin, Total 12/07/2017 5.7* 2.2 - 3.9 g/dL Corrected  . Albumin/Glob SerPl 12/07/2017 0.7  0.7 - 1.7 Corrected  . IFE 1 12/07/2017 Comment   Corrected   Comment: (NOTE) Immunofixation shows IgG monoclonal protein with kappa light chain specificity. Please note that samples from patients receiving DARZALEX(R) (daratumumab) treatment can appear as an "IgG kappa" and mask a complete response. If this patient is receiving DARA, this IFE assay interference can be removed by ordering test number [id number]-"Immunofixation, Daratumumab-Specific, Serum" and submitting a new sample for testing or by calling the lab to add this test to the current sample.   . Please Note 12/07/2017 Comment   Corrected   Comment: (NOTE) Protein electrophoresis scan will follow via computer, mail, or courier delivery. Performed At: [facility] [place], [place] [id number] Rush Farmer MD NO:[id number] Performed at [facility], [address] [place], [place] [address]      Pathology Orders Placed This Encounter  Procedures  . CT Chest W Contrast    Standing Status:   Future    Standing Expiration Date:    12/20/2018    Order Specific Question:   If indicated for the ordered procedure, I authorize the administration of contrast media per Radiology protocol    Answer:   Yes    Order Specific Question:   Preferred imaging location?    Answer:   [facility]    Order Specific Question:   Radiology Contrast Protocol - do NOT remove file path    Answer:   [email or web address]    Order Specific Question:   Reason for Exam additional comments    Answer:   pullmonary lesion noted on PET  . CBC with Differential/Platelet    Standing Status:   Future    Standing Expiration Date:   12/20/2018  . Comprehensive metabolic panel    Standing Status:   Future    Standing Expiration Date:   12/20/2018  . Lactate dehydrogenase    Standing Status:   Future    Standing Expiration Date:   12/20/2018  . Protein electrophoresis, serum    Standing Status:   Future    Standing Expiration Date:   12/20/2018  . IgG, IgA, IgM    Standing Status:   Future    Standing Expiration Date:   12/20/2018  . Kappa/lambda light chains    Standing Status:   Future    Standing Expiration Date:   12/20/2018   Pet Scan done 10/2016:   IMPRESSION: 1. No hypermetabolic osseous myeloma. 2. Mildly hypermetabolic right hilar lymph node, mildly increased in metabolism since 01/30/2016. Newly mildly hypermetabolic nonenlarged right lower paratracheal mediastinal lymph node. No acute pulmonary disease. These nodes are nonspecific and warrant attention on follow-up chest CT with IV contrast versus follow-up PET-CT. 3. Mildly enlarged prostate. New nonspecific mild focal hypermetabolism in the left prostate peripheral zone, without associated discrete mass on the CT images. Recommend correlation with serum PSA. 4. Aortic atherosclerosis.     Zoila Shutter MD

## 2018-02-22 IMAGING — PT NM PET IMAGE RESTAGE (PS) WHOLE BODY
7 of 8 series · 21 of 25 positions shown · non-contrast
Comparison: 01/30/2016 PET-CT.

CLINICAL DATA: Subsequent treatment strategy for smoldering
myeloma. Restaging.

EXAM:
NUCLEAR MEDICINE PET WHOLE BODY
TECHNIQUE: 9.8 mCi F-18 FDG was injected intravenously. Full-ring PET imaging
was performed from the vertex to the feet after the radiotracer. CT
data was obtained and used for attenuation correction and anatomic
localization.
FASTING BLOOD GLUCOSE:  Value: 94 mg/dl

[Series 3: ct wb 5.0 b31f · axial · 5.0mm · 0.98mm/px · z∈[-549,+1463]mm · 4 of 504 slices shown]
[im 1/504  full-range]
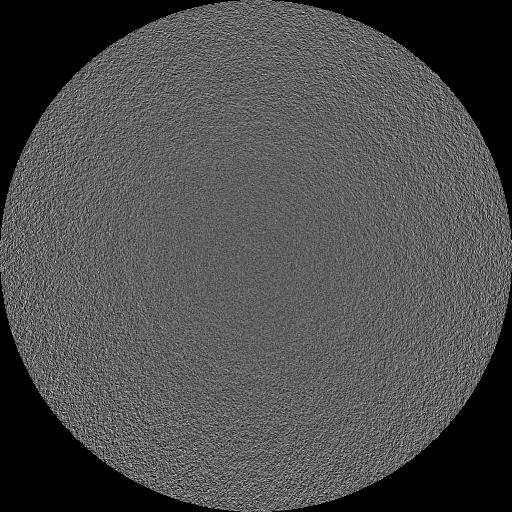
[im 126/504  soft-tissue]
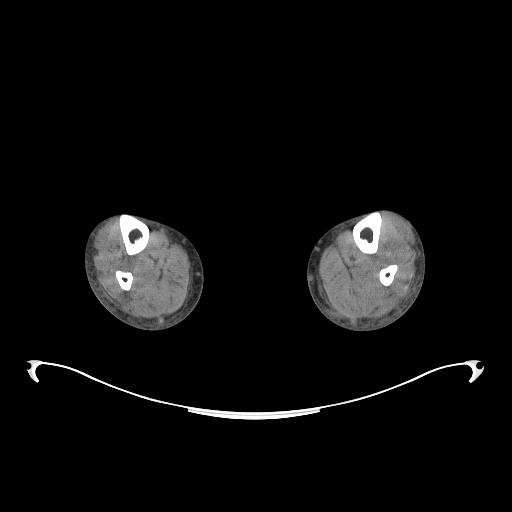
[im 252/504  soft-tissue]
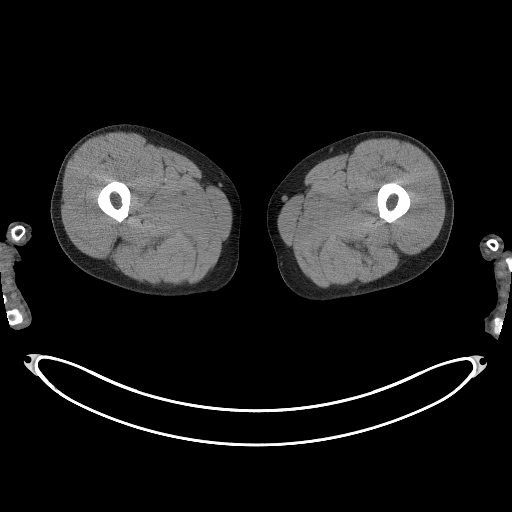
[im 504/504  soft-tissue]
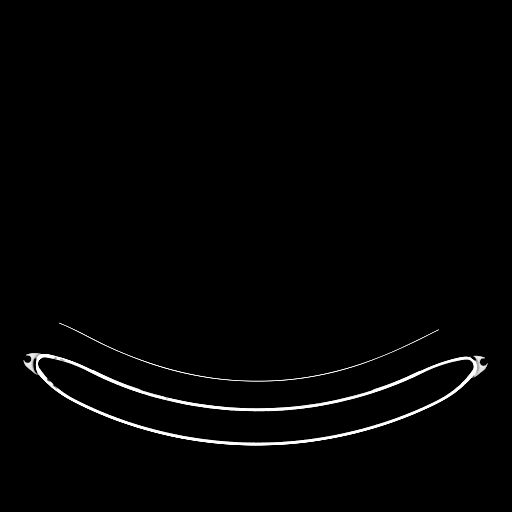

[Series 4: pet wb ac · axial · 5.0mm · 4.07mm/px · z∈[-541,+959]mm · 4 of 502 slices shown]
[im 1/502]
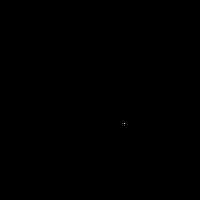
[im 126/502]
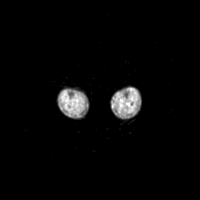
[im 251/502]
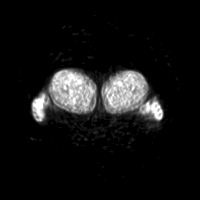
[im 376/502]
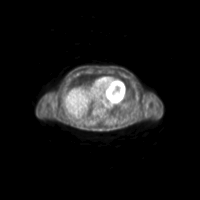

[Series 7: pet wb nac · axial · 5.0mm · 4.07mm/px · z∈[-513,+1463]mm · 5 of 495 slices shown]
[im 1/495]
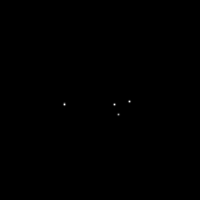
[im 124/495]
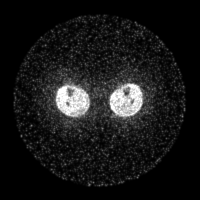
[im 248/495]
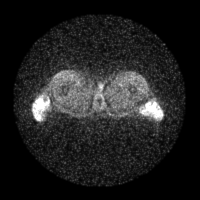
[im 371/495]
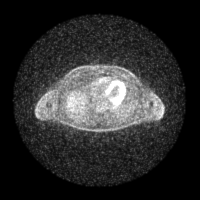
[im 495/495]
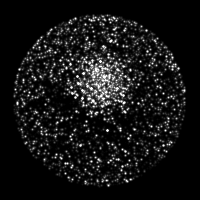

[Series 604: mip collection · coronal · 4.17mm/px · 1 of 32 slices shown]
[im 1/32]
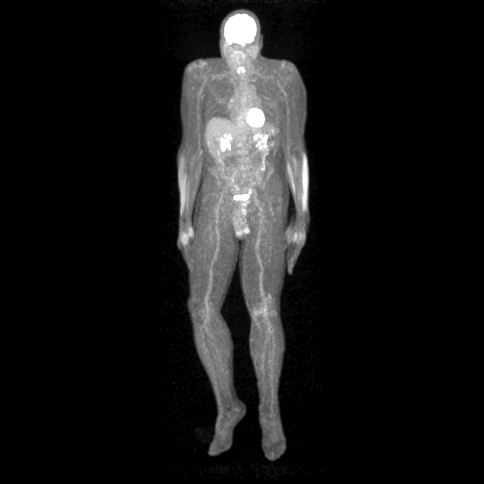

[Series 606: range-ct wb 5.0 (id)<alpha range> · 1 of 27 slices shown (1 of 2)]
[im 1/27]
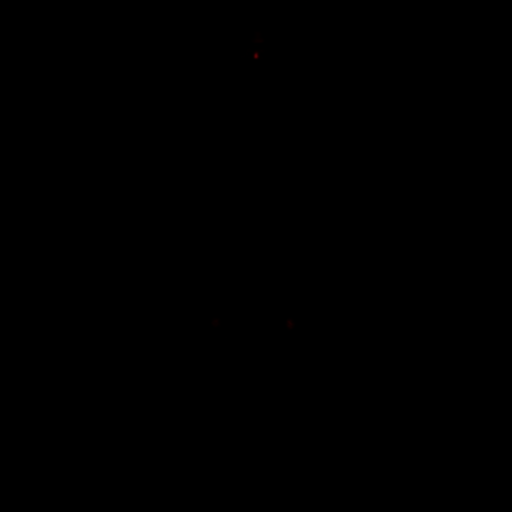

[Series 607: range-ct wb 5.0 (id)<alpha range> · 5 of 472 slices shown (2 of 2)]
[im 1/472]
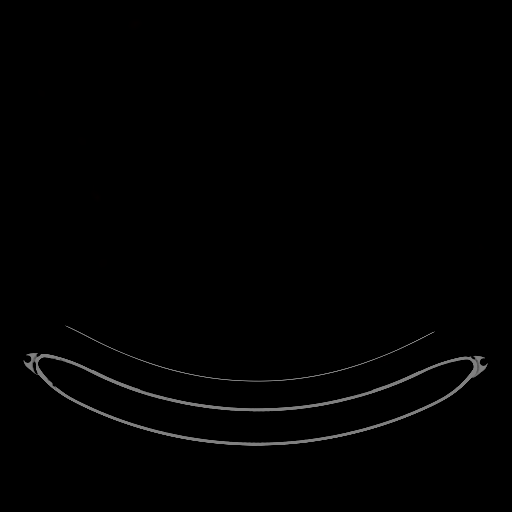
[im 95/472]
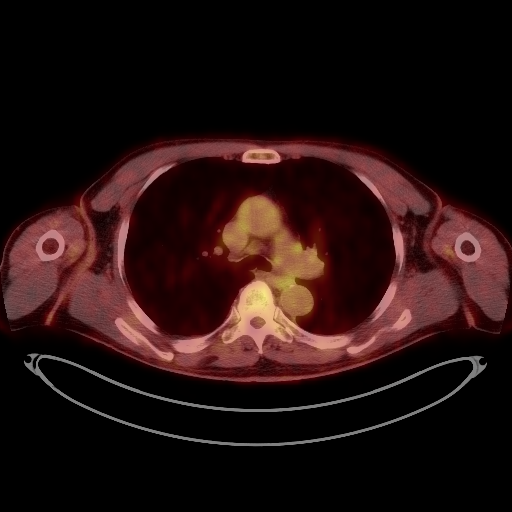
[im 189/472]
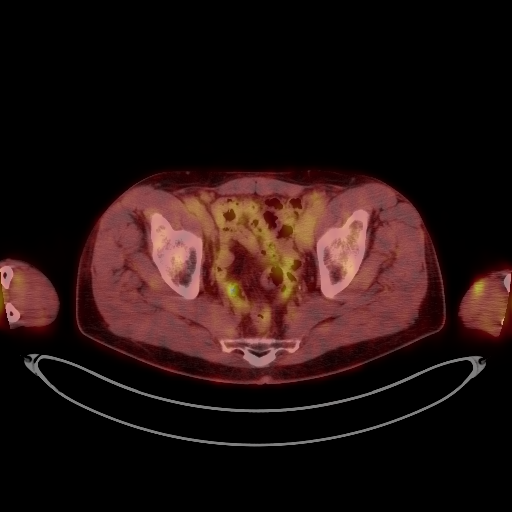
[im 377/472]
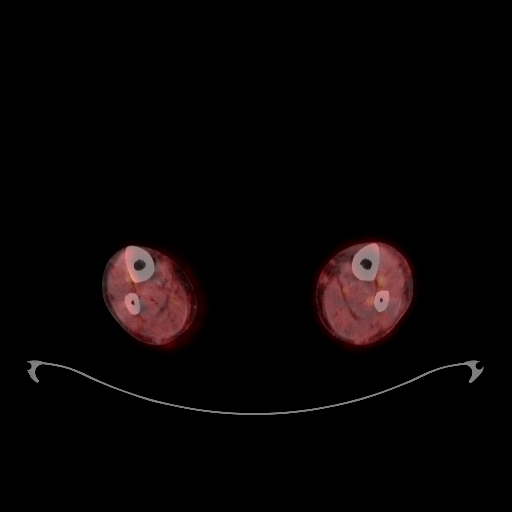
[im 472/472]
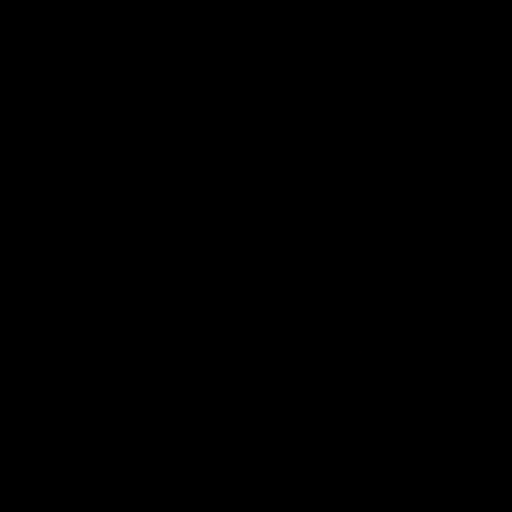

[Series 1073: results mm oncology reading · 0.67mm/px · 1 of 3 slices shown]
[im 1/3]
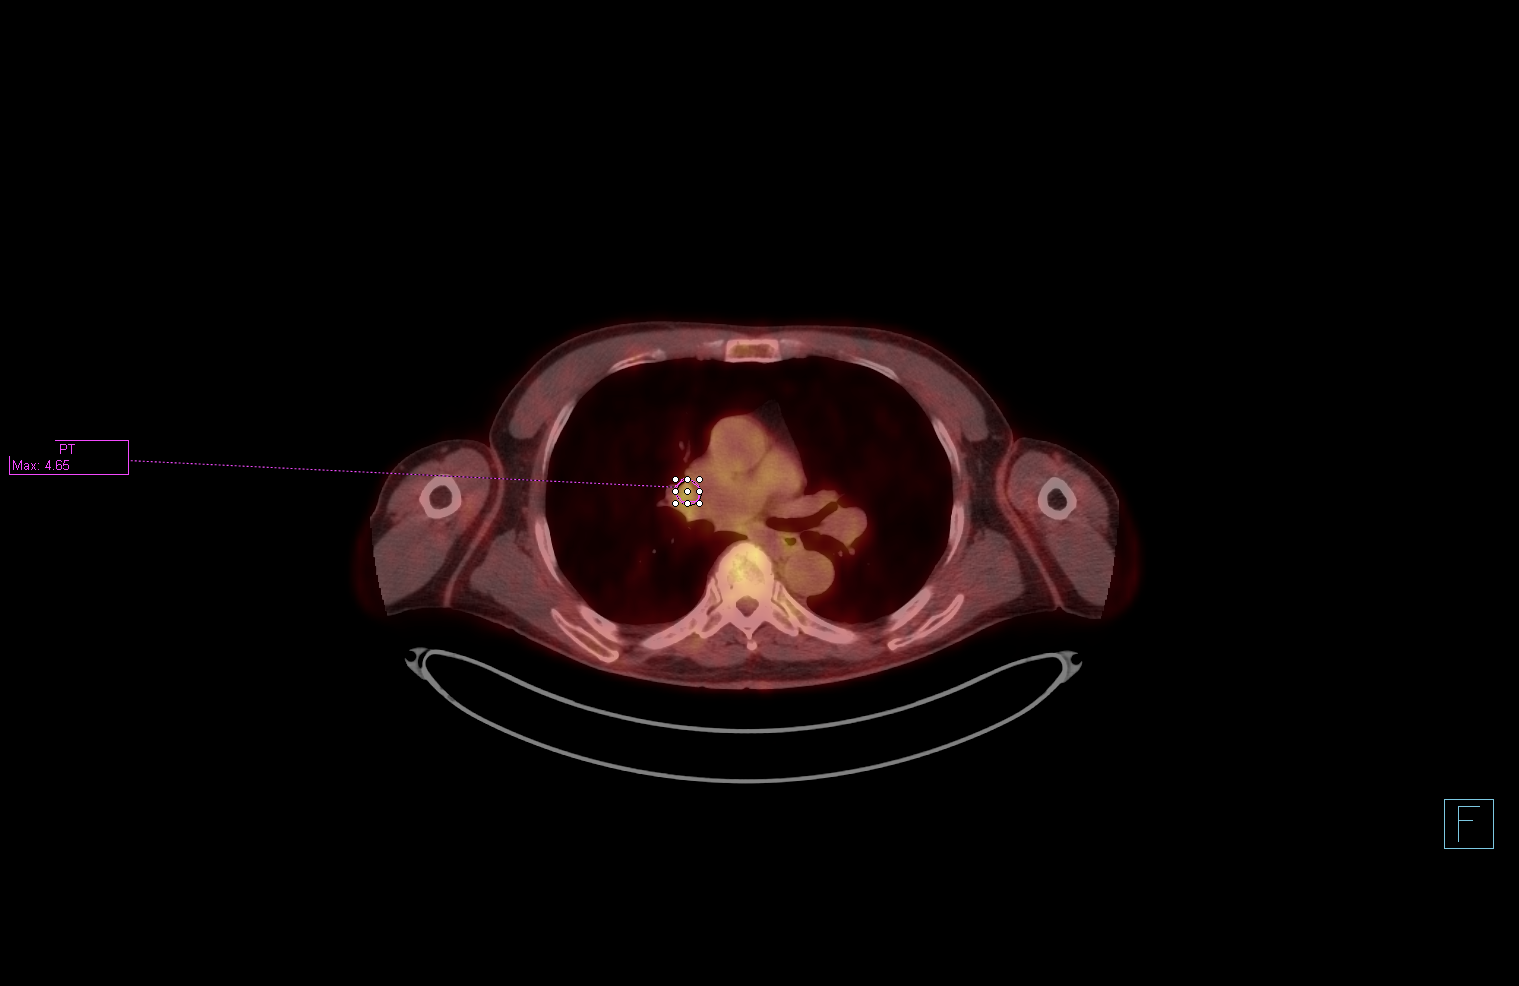

[21 of 25 positions shown; findings below may reference images not displayed]

FINDINGS: HEAD/NECK

No hypermetabolic activity in the scalp. No hypermetabolic cervical
lymph nodes. Symmetric hypermetabolism in the glottis without CT
correlate, probably physiologic.

CHEST

Mildly atherosclerotic nonaneurysmal thoracic aorta. No
hypermetabolic axillary lymphadenopathy.

Newly mildly hypermetabolic nonenlarged 0.7 cm right lower
paratracheal node with max SUV 3.8 (series 3/ image 102), stable in
size.

Mildly hypermetabolic right hilar lymph node with max SUV 4.7,
poorly delineated on the noncontrast CT images, previous max SUV
3.6, mildly increased.

No additional hypermetabolic mediastinal or hilar lymph nodes. No
significant pleural effusions (trace layering pleural fluid
bilaterally is stable and probably physiologic). No acute
consolidative airspace disease, lung masses or significant pulmonary
nodules.

ABDOMEN/PELVIS

No abnormal hypermetabolic activity within the liver, pancreas,
adrenal glands, or spleen. No hypermetabolic lymph nodes in the
abdomen or pelvis. Simple 2.2 cm left liver lobe cyst. Additional
subcentimeter hypodense liver lesions are too small to characterize
and stable, suggesting benign lesions. Simple 4.0 cm renal cyst in
the lateral interpolar left kidney. Atherosclerotic nonaneurysmal
abdominal aorta.

Mildly enlarged prostate. Nonspecific new mild focal hypermetabolism
in the left prostate peripheral zone with max SUV 5.9 without
associated discrete mass on the CT images.

SKELETON

No skeletal foci of hypermetabolism.

EXTREMITIES

No abnormal hypermetabolic activity in the lower extremities.
IMPRESSION: 1. No hypermetabolic osseous myeloma.
2. Mildly hypermetabolic right hilar lymph node, mildly increased in
metabolism since 01/30/2016. Newly mildly hypermetabolic nonenlarged
right lower paratracheal mediastinal lymph node. No acute pulmonary
disease. These nodes are nonspecific and warrant attention on
follow-up chest CT with IV contrast versus follow-up PET-CT.
3. Mildly enlarged prostate. New nonspecific mild focal
hypermetabolism in the left prostate peripheral zone, without
associated discrete mass on the CT images. Recommend correlation
with serum PSA.
4. Aortic atherosclerosis.

## 2018-04-13 ENCOUNTER — Ambulatory Visit (HOSPITAL_COMMUNITY)
Admission: RE | Admit: 2018-04-13 | Discharge: 2018-04-13 | Disposition: A | Discharge status: Home / Self Care | Payer: Medicare FFS | Source: Ambulatory Visit | Attending: Internal Medicine | Admitting: Internal Medicine

## 2018-04-13 ENCOUNTER — Encounter (HOSPITAL_COMMUNITY): Payer: Self-pay

## 2018-04-13 DIAGNOSIS — R590 Localized enlarged lymph nodes: Secondary | ICD-10-CM | POA: Diagnosis present

## 2018-04-13 LAB — POCT I-STAT CREATININE: Creatinine, Ser: 1.1 mg/dL (ref 0.61–1.24)

## 2018-04-13 MED ORDER — IOHEXOL 300 MG/ML  SOLN
75.0000 mL | Freq: Once | INTRAMUSCULAR | Status: AC | PRN
  Administered 2018-04-13: 75 mL via INTRAVENOUS

## 2018-04-19 ENCOUNTER — Inpatient Hospital Stay (HOSPITAL_COMMUNITY): Payer: Medicare FFS | Attending: Hematology

## 2018-04-19 ENCOUNTER — Ambulatory Visit (HOSPITAL_COMMUNITY)
Admission: RE | Admit: 2018-04-19 | Discharge: 2018-04-19 | Disposition: A | Discharge status: Home / Self Care | Payer: Medicare FFS | Source: Ambulatory Visit | Attending: Nurse Practitioner | Admitting: Nurse Practitioner

## 2018-04-19 ENCOUNTER — Encounter (HOSPITAL_COMMUNITY): Payer: Self-pay | Admitting: Hematology

## 2018-04-19 ENCOUNTER — Inpatient Hospital Stay (HOSPITAL_COMMUNITY): Payer: Medicare FFS | Attending: Hematology | Admitting: Hematology

## 2018-04-19 VITALS — BP 146/81 | HR 69 | Temp 98.7°F | Resp 16 | Wt 185.1 lb

## 2018-04-19 DIAGNOSIS — R590 Localized enlarged lymph nodes: Secondary | ICD-10-CM

## 2018-04-19 DIAGNOSIS — I10 Essential (primary) hypertension: Secondary | ICD-10-CM | POA: Diagnosis not present

## 2018-04-19 DIAGNOSIS — Z79899 Other long term (current) drug therapy: Secondary | ICD-10-CM | POA: Insufficient documentation

## 2018-04-19 DIAGNOSIS — C9000 Multiple myeloma not having achieved remission: Secondary | ICD-10-CM | POA: Insufficient documentation

## 2018-04-19 DIAGNOSIS — D472 Monoclonal gammopathy: Secondary | ICD-10-CM

## 2018-04-19 LAB — COMPREHENSIVE METABOLIC PANEL
ALBUMIN: 3.2 g/dL — AB (ref 3.5–5.0)
ALT: 14 U/L (ref 0–44)
AST: 28 U/L (ref 15–41)
Alkaline Phosphatase: 48 U/L (ref 38–126)
Anion gap: 3 — ABNORMAL LOW (ref 5–15)
BUN: 20 mg/dL (ref 8–23)
CHLORIDE: 107 mmol/L (ref 98–111)
CO2: 24 mmol/L (ref 22–32)
CREATININE: 1.39 mg/dL — AB (ref 0.61–1.24)
Calcium: 8.6 mg/dL — ABNORMAL LOW (ref 8.9–10.3)
GFR calc Af Amer: 52 mL/min — ABNORMAL LOW (ref >60)
GFR, EST NON AFRICAN AMERICAN: 45 mL/min — AB (ref >60)
GLUCOSE: 90 mg/dL (ref 70–99)
Potassium: 3.9 mmol/L (ref 3.5–5.1)
Sodium: 134 mmol/L — ABNORMAL LOW (ref 135–145)
Total Bilirubin: 0.6 mg/dL (ref 0.3–1.2)
Total Protein: 9.9 g/dL — ABNORMAL HIGH (ref 6.5–8.1)

## 2018-04-19 LAB — CBC WITH DIFFERENTIAL/PLATELET
BASOS ABS: 0 10*3/uL (ref 0.0–0.1)
Basophils Relative: 0 %
EOS PCT: 3 %
Eosinophils Absolute: 0.1 10*3/uL (ref 0.0–0.7)
HCT: 36.7 % — ABNORMAL LOW (ref 39.0–52.0)
Hemoglobin: 11.6 g/dL — ABNORMAL LOW (ref 13.0–17.0)
LYMPHS PCT: 35 %
Lymphs Abs: 1.2 10*3/uL (ref 0.7–4.0)
MCH: 32 pg (ref 26.0–34.0)
MCHC: 31.6 g/dL (ref 30.0–36.0)
MCV: 101.4 fL — AB (ref 78.0–100.0)
Monocytes Absolute: 0.5 10*3/uL (ref 0.1–1.0)
Monocytes Relative: 14 %
NEUTROS ABS: 1.6 10*3/uL — AB (ref 1.7–7.7)
Neutrophils Relative %: 48 %
PLATELETS: 173 10*3/uL (ref 150–400)
RBC: 3.62 MIL/uL — ABNORMAL LOW (ref 4.22–5.81)
RDW: 15.7 % — ABNORMAL HIGH (ref 11.5–15.5)
WBC: 3.4 10*3/uL — ABNORMAL LOW (ref 4.0–10.5)

## 2018-04-19 LAB — LACTATE DEHYDROGENASE: LDH: 213 U/L — AB (ref 98–192)

## 2018-04-19 NOTE — Patient Instructions (Signed)
[facility] at [facility] Discharge Instructions  You saw Dr. Katragadda today.   Thank you for choosing [facility] at [facility] to provide your oncology and hematology care.  To afford each patient quality time with our provider, please arrive at least 15 minutes before your scheduled appointment time.   If you have a lab appointment with the Cancer Center please come in thru the  Main Entrance and check in at the main information desk  You need to re-schedule your appointment should you arrive 10 or more minutes late.  We strive to give you quality time with our providers, and arriving late affects you and other patients whose appointments are after yours.  Also, if you no show three or more times for appointments you may be dismissed from the clinic at the providers discretion.     Again, thank you for choosing [facility].  Our hope is that these requests will decrease the amount of time that you wait before being seen by our physicians.       _____________________________________________________________  Should you have questions after your visit to [facility], please contact our office at [phone] between the hours of 8:30 a.m. and 4:30 p.m.  Voicemails left after 4:30 p.m. will not be returned until the following business day.  For prescription refill requests, have your pharmacy contact our office.       Resources For Cancer Patients and their Caregivers ? American Cancer Society: Can assist with transportation, wigs, general needs, runs Look Good Feel Better.        1-888-227-6333 ? Cancer Care: Provides financial assistance, online support groups, medication/co-pay assistance.  1-800-813-HOPE (4673) ? [facility] Assists [place] cancer patients and their families through emotional , educational and financial support.  336-427-4357 ? [place] DSS Where to apply for  food stamps, Medicaid and utility assistance. 336-342-1394 ? [employer]: Transportation to medical appointments. 336-347-2287 ? Social Security Administration: May apply for disability if have a Stage IV cancer. 336-342-7796 1-800-772-1213 ? [place] Aging, Disability and Transit Services: Assists with nutrition, care and transit needs. 336-349-2343  Cancer Center Support Programs:   > Cancer Support Group  2nd Tuesday of the month 1pm-2pm, Journey Room   > Creative Journey  3rd Tuesday of the month 1130am-1pm, Journey Room     

## 2018-04-19 NOTE — Assessment & Plan Note (Signed)
1.  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  Last M spike was 3.7 g. -He denies any new onset bone pains.  I have reviewed lab work with him today.  Creatinine was elevated at 1.39, slightly up from his baseline.  Calcium was normal at 8.6.  Hemoglobin is also stable.  His last PET CT scan was in January 2018 which did not reveal any suspicious myeloma lesions.  SPEP and free light chains from today is pending. - Upon review of his medications, he was noted to be on HCTZ 50 mg daily.  He has been on it for a while.  He reports no new medications.  He reportedly had a cortisone shot in his left shoulder for pain. - We will follow-up on his M spike from today.  I will also order a bone survey to look for any lytic lesions today.  Otherwise we will see him back in 3 months with repeat blood work a week prior.  2.  Mediastinal adenopathy: - His prior PET CT scan showed mediastinal adenopathy.  He underwent CT scan of his chest on 04/13/2018 to follow-up on this.  I have reviewed the results with him.  There is no adenopathy on the current scan.

## 2018-04-19 NOTE — Progress Notes (Signed)
Dale Suarez,  [id number]   CLINIC:  Medical Oncology/Hematology  PCP:  Neale Burly, Dale Suarez [place] [id number] [phone]   REASON FOR VISIT:  Follow-up for smoldering myeloma.  CURRENT THERAPY: Observation.   INTERVAL HISTORY:  Mr. Faller 85 y.o. male returns for follow-up of smoldering myeloma.  Denies any new onset bone pains.  Denies any fevers, night sweats or weight loss in the last 6 months.  Denies any infections in the last 6 months.  He reportedly had a cortisone shot in his left shoulder for arthritic pains.  He is continuing to take hydrochlorothiazide 50 mg daily.  Energy levels reported 50%.  Appetite is 75%.  He continues to live independently at home.    REVIEW OF SYSTEMS:  Review of Systems  Constitutional: Positive for fatigue.  Neurological: Positive for dizziness.  Hematological: Bruises/bleeds easily.  All other systems reviewed and are negative.    PAST MEDICAL/SURGICAL HISTORY:  Past Medical History:  Diagnosis Date  . Anemia   . Glaucoma   . Hypertension    History reviewed. No pertinent surgical history.   SOCIAL HISTORY:  Social History   Socioeconomic History  . Marital status: Widowed    Spouse name: Not on file  . Number of children: Not on file  . Years of education: Not on file  . Highest education level: Not on file  Occupational History  . Not on file  Social Needs  . Financial resource strain: Not on file  . Food insecurity:    Worry: Not on file    Inability: Not on file  . Transportation needs:    Medical: Not on file    Non-medical: Not on file  Tobacco Use  . Smoking status: Never Smoker  . Smokeless tobacco: Never Used  Substance and Sexual Activity  . Alcohol use: No  . Drug use: No  . Sexual activity: Not Currently  Lifestyle  . Physical activity:    Days per week: Not on file    Minutes per session: Not on file  . Stress: Not on file    Relationships  . Social connections:    Talks on phone: Not on file    Gets together: Not on file    Attends religious service: Not on file    Active member of club or organization: Not on file    Attends meetings of clubs or organizations: Not on file    Relationship status: Not on file  . Intimate partner violence:    Fear of current or ex partner: Not on file    Emotionally abused: Not on file    Physically abused: Not on file    Forced sexual activity: Not on file  Other Topics Concern  . Not on file  Social History Narrative  . Not on file    FAMILY HISTORY:  Family History  Problem Relation Age of Onset  . Hypertension Mother   . Heart attack Mother   . Heart attack Father   . Stroke Sister   . Alcoholism Brother   . Cirrhosis Brother     CURRENT MEDICATIONS:  Outpatient Encounter Medications as of 04/19/2018  Medication Sig Note  . albuterol (PROVENTIL HFA;VENTOLIN HFA) 108 (90 Base) MCG/ACT inhaler Inhale into the lungs. 06/29/2016: Received from: [place]: Inhale 2 puffs into the lungs as needed for Wheezing.  Marland Kitchen aspirin EC 81 MG tablet Take 81 mg by  mouth daily.   . Cholecalciferol (VITAMIN D3) 2000 units TABS Take 2,000 Units by mouth daily.  06/29/2016: Received from: [place]: Take by mouth daily.  . Cod Liver Oil OIL Take by mouth daily.  06/29/2016: Received from: [place]: Take 1 tablet by mouth daily.  . cyanocobalamin 500 MCG tablet Take 500 mcg by mouth daily.   . dorzolamide (TRUSOPT) 2 % ophthalmic solution INSTILL ONE DROP IN [facility] EYE TWICE DAILY   . doxazosin (CARDURA) 8 MG tablet Take 8 mg by mouth daily.  06/29/2016: Received from: [place]: Take 8 mg by mouth at bedtime.  . Ferrous Gluconate (IRON 27 PO) Take 1 tablet every morning by mouth.   . Glucosamine Sulfate 1000 MG CAPS Take by mouth. Takes twice a day 06/29/2016: Received from: [place]: Take 1 capsule by mouth  daily.  . hydrochlorothiazide (HYDRODIURIL) 50 MG tablet Take 50 mg by mouth daily.  06/29/2016: Received from: [place]: Take 50 mg by mouth daily.  Marland Kitchen latanoprost (XALATAN) 0.005 % ophthalmic solution Place 1 drop into both eyes at bedtime. 06/29/2016: Received from: [place]  . loratadine (CLARITIN) 10 MG tablet Take 10 mg by mouth daily.  06/29/2016: Received from: [place]: Take 10 mg by mouth as needed for Allergies.  . Sildenafil Citrate (VIAGRA PO) Take by mouth.   . warfarin (COUMADIN) 5 MG tablet Take 5 mg by mouth one time only at 6 PM.  06/29/2016: Received from: [place]: Take 5 mg by mouth daily.   No facility-administered encounter medications on file as of 04/19/2018.     ALLERGIES:  No Known Allergies   PHYSICAL EXAM:  ECOG Performance status: 1  Vitals:   04/19/18 1413  BP: (!) 146/81  Pulse: 69  Resp: 16  Temp: 98.7 F (37.1 C)  SpO2: 100%   Filed Weights   04/19/18 1413  Weight: 185 lb 1.6 oz (84 kg)    Physical Exam   LABORATORY DATA:  I have reviewed the labs as listed.  CBC    Component Value Date/Time   WBC 3.4 (L) 04/19/2018 1332   RBC 3.62 (L) 04/19/2018 1332   HGB 11.6 (L) 04/19/2018 1332   HCT 36.7 (L) 04/19/2018 1332   PLT 173 04/19/2018 1332   MCV 101.4 (H) 04/19/2018 1332   MCH 32.0 04/19/2018 1332   MCHC 31.6 04/19/2018 1332   RDW 15.7 (H) 04/19/2018 1332   LYMPHSABS 1.2 04/19/2018 1332   MONOABS 0.5 04/19/2018 1332   EOSABS 0.1 04/19/2018 1332   BASOSABS 0.0 04/19/2018 1332   CMP Latest Ref Rng & Units 04/19/2018 04/13/2018 11/30/2017  Glucose 70 - 99 mg/dL 90 - 98  BUN 8 - 23 mg/dL 20 - 18  Creatinine 0.61 - 1.24 mg/dL 1.39(H) 1.10 1.19  Sodium 135 - 145 mmol/L 134(L) - 134(L)  Potassium 3.5 - 5.1 mmol/L 3.9 - 3.8  Chloride 98 - 111 mmol/L 107 - 105  CO2 22 - 32 mmol/L 24 - 25  Calcium 8.9 - 10.3 mg/dL 8.6(L) - 8.9  Total Protein 6.5 - 8.1 g/dL 9.9(H) - 10.0(H)  Total  Bilirubin 0.3 - 1.2 mg/dL 0.6 - 0.6  Alkaline Phos 38 - 126 U/L 48 - 45  AST 15 - 41 U/L 28 - 30  ALT 0 - 44 U/L 14 - 13(L)       DIAGNOSTIC IMAGING:  I have personally reviewed CT scan  images dated 04/13/2018 and discussed them with the patient.  I have also reviewed his prior PET CT scan results.   ASSESSMENT & PLAN:   Multiple myeloma ([place]) 1.  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  Last M spike was 3.7 g. -He denies any new onset bone pains.  I have reviewed lab work with him today.  Creatinine was elevated at 1.39, slightly up from his baseline.  Calcium was normal at 8.6.  Hemoglobin is also stable.  His last PET CT scan was in January 2018 which did not reveal any suspicious myeloma lesions.  SPEP and free light chains from today is pending. - Upon review of his medications, he was noted to be on HCTZ 50 mg daily.  He has been on it for a while.  He reports no new medications.  He reportedly had a cortisone shot in his left shoulder for pain. - We will follow-up on his M spike from today.  I will also order a bone survey to look for any lytic lesions today.  Otherwise we will see him back in 3 months with repeat blood work a week prior.  2.  Mediastinal adenopathy: - His prior PET CT scan showed mediastinal adenopathy.  He underwent CT scan of his chest on 04/13/2018 to follow-up on this.  I have reviewed the results with him.  There is no adenopathy on the current scan.      Orders placed this encounter:  Orders Placed This Encounter  Procedures  . DG Bone Survey Met  . Protein electrophoresis, serum  . Lactate dehydrogenase, isoenzymes  . Immunofixation electrophoresis  . Kappa/lambda light chains  . CBC with Differential/Platelet  . Comprehensive metabolic panel      Dale Jack, Dale Suarez [place] [phone]

## 2018-04-20 ENCOUNTER — Telehealth (HOSPITAL_COMMUNITY): Payer: Self-pay | Admitting: Nurse Practitioner

## 2018-04-20 LAB — KAPPA/LAMBDA LIGHT CHAINS
Kappa free light chain: 149.8 mg/L — ABNORMAL HIGH (ref 3.3–19.4)
Kappa, lambda light chain ratio: 12.08 — ABNORMAL HIGH (ref 0.26–1.65)
Lambda free light chains: 12.4 mg/L (ref 5.7–26.3)

## 2018-04-20 LAB — PROTEIN ELECTROPHORESIS, SERUM
A/G Ratio: 0.6 — ABNORMAL LOW (ref 0.7–1.7)
ALBUMIN ELP: 3.6 g/dL (ref 2.9–4.4)
Alpha-1-Globulin: 0.2 g/dL (ref 0.0–0.4)
Alpha-2-Globulin: 0.5 g/dL (ref 0.4–1.0)
Beta Globulin: 1 g/dL (ref 0.7–1.3)
GAMMA GLOBULIN: 4.2 g/dL — AB (ref 0.4–1.8)
Globulin, Total: 6 g/dL — ABNORMAL HIGH (ref 2.2–3.9)
M-SPIKE, %: 3.9 g/dL — AB
TOTAL PROTEIN ELP: 9.6 g/dL — AB (ref 6.0–8.5)

## 2018-04-20 LAB — IGG, IGA, IGM
IGG (IMMUNOGLOBIN G), SERUM: 5793 mg/dL — AB (ref 700–1600)
IgA: <5 mg/dL — ABNORMAL LOW (ref 61–437)
IgM (Immunoglobulin M), Srm: 12 mg/dL — ABNORMAL LOW (ref 15–143)

## 2018-07-13 ENCOUNTER — Inpatient Hospital Stay (HOSPITAL_COMMUNITY): Payer: Medicare FFS | Attending: Hematology

## 2018-07-13 DIAGNOSIS — D472 Monoclonal gammopathy: Secondary | ICD-10-CM

## 2018-07-13 DIAGNOSIS — C9000 Multiple myeloma not having achieved remission: Secondary | ICD-10-CM | POA: Diagnosis present

## 2018-07-13 LAB — COMPREHENSIVE METABOLIC PANEL
ALT: 13 U/L (ref 0–44)
AST: 26 U/L (ref 15–41)
Albumin: 3.2 g/dL — ABNORMAL LOW (ref 3.5–5.0)
Alkaline Phosphatase: 43 U/L (ref 38–126)
Anion gap: <3 — ABNORMAL LOW (ref 5–15)
BILIRUBIN TOTAL: 0.6 mg/dL (ref 0.3–1.2)
BUN: 17 mg/dL (ref 8–23)
CHLORIDE: 107 mmol/L (ref 98–111)
CO2: 25 mmol/L (ref 22–32)
Calcium: 8.7 mg/dL — ABNORMAL LOW (ref 8.9–10.3)
Creatinine, Ser: 1.29 mg/dL — ABNORMAL HIGH (ref 0.61–1.24)
GFR calc Af Amer: 57 mL/min — ABNORMAL LOW (ref >60)
GFR, EST NON AFRICAN AMERICAN: 49 mL/min — AB (ref >60)
Glucose, Bld: 69 mg/dL — ABNORMAL LOW (ref 70–99)
POTASSIUM: 3.8 mmol/L (ref 3.5–5.1)
Sodium: 134 mmol/L — ABNORMAL LOW (ref 135–145)
TOTAL PROTEIN: 10.2 g/dL — AB (ref 6.5–8.1)

## 2018-07-13 LAB — CBC WITH DIFFERENTIAL/PLATELET
BASOS ABS: 0 10*3/uL (ref 0.0–0.1)
Basophils Relative: 0 %
Eosinophils Absolute: 0.1 10*3/uL (ref 0.0–0.7)
Eosinophils Relative: 2 %
HEMATOCRIT: 39 % (ref 39.0–52.0)
Hemoglobin: 12.7 g/dL — ABNORMAL LOW (ref 13.0–17.0)
LYMPHS PCT: 36 %
Lymphs Abs: 1.4 10*3/uL (ref 0.7–4.0)
MCH: 33 pg (ref 26.0–34.0)
MCHC: 32.6 g/dL (ref 30.0–36.0)
MCV: 101.3 fL — AB (ref 78.0–100.0)
MONO ABS: 0.6 10*3/uL (ref 0.1–1.0)
MONOS PCT: 15 %
NEUTROS ABS: 1.8 10*3/uL (ref 1.7–7.7)
NEUTROS PCT: 47 %
PLATELETS: 156 10*3/uL (ref 150–400)
RBC: 3.85 MIL/uL — ABNORMAL LOW (ref 4.22–5.81)
RDW: 14.7 % (ref 11.5–15.5)
WBC: 3.9 10*3/uL — AB (ref 4.0–10.5)

## 2018-07-14 LAB — KAPPA/LAMBDA LIGHT CHAINS
KAPPA, LAMDA LIGHT CHAIN RATIO: 12.34 — AB (ref 0.26–1.65)
Kappa free light chain: 165.3 mg/L — ABNORMAL HIGH (ref 3.3–19.4)
Lambda free light chains: 13.4 mg/L (ref 5.7–26.3)

## 2018-07-14 LAB — PROTEIN ELECTROPHORESIS, SERUM
A/G RATIO SPE: 0.6 — AB (ref 0.7–1.7)
ALBUMIN ELP: 3.8 g/dL (ref 2.9–4.4)
Alpha-1-Globulin: 0.2 g/dL (ref 0.0–0.4)
Alpha-2-Globulin: 0.6 g/dL (ref 0.4–1.0)
Beta Globulin: 0.9 g/dL (ref 0.7–1.3)
GAMMA GLOBULIN: 4.4 g/dL — AB (ref 0.4–1.8)
GLOBULIN, TOTAL: 6.2 g/dL — AB (ref 2.2–3.9)
M-Spike, %: 4.1 g/dL — ABNORMAL HIGH
TOTAL PROTEIN ELP: 10 g/dL — AB (ref 6.0–8.5)

## 2018-07-15 LAB — IMMUNOFIXATION ELECTROPHORESIS
IGG (IMMUNOGLOBIN G), SERUM: 5830 mg/dL — AB (ref 700–1600)
IGM (IMMUNOGLOBULIN M), SRM: 15 mg/dL (ref 15–143)
TOTAL PROTEIN ELP: 9.4 g/dL — AB (ref 6.0–8.5)

## 2018-07-19 LAB — LACTATE DEHYDROGENASE, ISOENZYMES
LDH 1: 25 % (ref 17–32)
LDH 2: 30 % (ref 25–40)
LDH 3: 26 % (ref 17–27)
LDH 4: 11 % (ref 5–13)
LDH 5: 8 % (ref 4–20)
LDH Isoenzymes, Total: 228 IU/L — ABNORMAL HIGH (ref 121–224)

## 2018-07-20 ENCOUNTER — Inpatient Hospital Stay (HOSPITAL_COMMUNITY): Payer: Medicare FFS | Attending: Hematology | Admitting: Hematology

## 2018-07-20 ENCOUNTER — Encounter (HOSPITAL_COMMUNITY): Payer: Self-pay | Admitting: Hematology

## 2018-07-20 ENCOUNTER — Other Ambulatory Visit: Payer: Self-pay

## 2018-07-20 VITALS — BP 160/87 | HR 60 | Temp 98.3°F | Resp 16 | Wt 194.5 lb

## 2018-07-20 DIAGNOSIS — R590 Localized enlarged lymph nodes: Secondary | ICD-10-CM | POA: Insufficient documentation

## 2018-07-20 DIAGNOSIS — D472 Monoclonal gammopathy: Secondary | ICD-10-CM

## 2018-07-20 DIAGNOSIS — C9000 Multiple myeloma not having achieved remission: Secondary | ICD-10-CM

## 2018-07-20 NOTE — Progress Notes (Signed)
[place], [place] [address]   CLINIC:  Medical Oncology/Hematology  PCP:  Neale Burly, [place] [phone]   REASON FOR VISIT: Follow-up for smoldering myeloma  CURRENT THERAPY: Observation   INTERVAL HISTORY:  Mr. Granito 84 y.o. male returns for routine follow-up for smoldering myeloma. Patient is here today and doing well. He has no complaints at this time. He lives at home alone and performs all his own ADLs. His appetite and energy level are both at 75%. He is maintaining his weight at this time. He denies any new pain. Denies any nausea, vomiting, or diarrhea. Denies any night sweats, weight loss, fevers, or chills.     REVIEW OF SYSTEMS:  Review of Systems  Neurological: Positive for extremity weakness.  Hematological: Bruises/bleeds easily.  All other systems reviewed and are negative.    PAST MEDICAL/SURGICAL HISTORY:  Past Medical History:  Diagnosis Date  . Anemia   . Glaucoma   . Hypertension    History reviewed. No pertinent surgical history.   SOCIAL HISTORY:  Social History   Socioeconomic History  . Marital status: Widowed    Spouse name: Not on file  . Number of children: Not on file  . Years of education: Not on file  . Highest education level: Not on file  Occupational History  . Not on file  Social Needs  . Financial resource strain: Not on file  . Food insecurity:    Worry: Not on file    Inability: Not on file  . Transportation needs:    Medical: Not on file    Non-medical: Not on file  Tobacco Use  . Smoking status: Never Smoker  . Smokeless tobacco: Never Used  Substance and Sexual Activity  . Alcohol use: No  . Drug use: No  . Sexual activity: Not Currently  Lifestyle  . Physical activity:    Days per week: Not on file    Minutes per session: Not on file  . Stress: Not on file  Relationships  . Social connections:    Talks on phone: Not on  file    Gets together: Not on file    Attends religious service: Not on file    Active member of club or organization: Not on file    Attends meetings of clubs or organizations: Not on file    Relationship status: Not on file  . Intimate partner violence:    Fear of current or ex partner: Not on file    Emotionally abused: Not on file    Physically abused: Not on file    Forced sexual activity: Not on file  Other Topics Concern  . Not on file  Social History Narrative  . Not on file    FAMILY HISTORY:  Family History  Problem Relation Age of Onset  . Hypertension Mother   . Heart attack Mother   . Heart attack Father   . Stroke Sister   . Alcoholism Brother   . Cirrhosis Brother     CURRENT MEDICATIONS:  Outpatient Encounter Medications as of 07/20/2018  Medication Sig Note  . albuterol (PROVENTIL HFA;VENTOLIN HFA) 108 (90 Base) MCG/ACT inhaler Inhale into the lungs. 06/29/2016: Received from: [place]: Inhale 2 puffs into the lungs as needed for Wheezing.  Marland Kitchen aspirin EC 81 MG tablet Take 81 mg by mouth daily.   . Cholecalciferol (VITAMIN D3) 2000 units TABS Take 2,000  Units by mouth daily.  06/29/2016: Received from: [place]: Take by mouth daily.  . Cod Liver Oil OIL Take by mouth daily.  06/29/2016: Received from: [place]: Take 1 tablet by mouth daily.  . cyanocobalamin 500 MCG tablet Take 500 mcg by mouth daily.   . dorzolamide (TRUSOPT) 2 % ophthalmic solution INSTILL ONE DROP IN [facility] EYE TWICE DAILY   . doxazosin (CARDURA) 8 MG tablet Take 8 mg by mouth daily.  06/29/2016: Received from: [place]: Take 8 mg by mouth at bedtime.  . Ferrous Gluconate (IRON 27 PO) Take 1 tablet every morning by mouth.   . Glucosamine Sulfate 1000 MG CAPS Take by mouth. Takes twice a day 06/29/2016: Received from: [place]: Take 1 capsule by mouth daily.  . hydrochlorothiazide (HYDRODIURIL) 50 MG tablet Take 50 mg  by mouth daily.  06/29/2016: Received from: [place]: Take 50 mg by mouth daily.  Marland Kitchen latanoprost (XALATAN) 0.005 % ophthalmic solution Place 1 drop into both eyes at bedtime. 06/29/2016: Received from: [place]  . loratadine (CLARITIN) 10 MG tablet Take 10 mg by mouth daily.  06/29/2016: Received from: [place]: Take 10 mg by mouth as needed for Allergies.  . Sildenafil Citrate (VIAGRA PO) Take by mouth.   . warfarin (COUMADIN) 5 MG tablet Take 5 mg by mouth one time only at 6 PM.  06/29/2016: Received from: [place]: Take 5 mg by mouth daily.   No facility-administered encounter medications on file as of 07/20/2018.     ALLERGIES:  No Known Allergies   PHYSICAL EXAM:  ECOG Performance status: 1  Vitals:   07/20/18 1534  BP: (!) 160/87  Pulse: 60  Resp: 16  Temp: 98.3 F (36.8 C)  SpO2: 100%   Filed Weights   07/20/18 1534  Weight: 194 lb 8 oz (88.2 kg)    Physical Exam  Constitutional: He is oriented to person, place, and time. He appears well-developed and well-nourished.  Cardiovascular: Normal rate, regular rhythm and normal heart sounds.  Pulmonary/Chest: Effort normal and breath sounds normal.  Musculoskeletal: Normal range of motion.  Neurological: He is alert and oriented to person, place, and time.  Skin: Skin is warm and dry.  Psychiatric: He has a normal mood and affect. His behavior is normal. Judgment and thought content normal.     LABORATORY DATA:  I have reviewed the labs as listed.  CBC    Component Value Date/Time   WBC 3.9 (L) 07/13/2018 1347   RBC 3.85 (L) 07/13/2018 1347   HGB 12.7 (L) 07/13/2018 1347   HCT 39.0 07/13/2018 1347   PLT 156 07/13/2018 1347   MCV 101.3 (H) 07/13/2018 1347   MCH 33.0 07/13/2018 1347   MCHC 32.6 07/13/2018 1347   RDW 14.7 07/13/2018 1347   LYMPHSABS 1.4 07/13/2018 1347   MONOABS 0.6 07/13/2018 1347   EOSABS 0.1 07/13/2018 1347   BASOSABS 0.0 07/13/2018 1347    CMP Latest Ref Rng & Units 07/13/2018 04/19/2018 04/13/2018  Glucose 70 - 99 mg/dL 69(L) 90 -  BUN 8 - 23 mg/dL 17 20 -  Creatinine 0.61 - 1.24 mg/dL 1.29(H) 1.39(H) 1.10  Sodium 135 - 145 mmol/L 134(L) 134(L) -  Potassium 3.5 - 5.1 mmol/L 3.8 3.9 -  Chloride 98 - 111 mmol/L 107 107 -  CO2 22 - 32 mmol/L 25 24 -  Calcium 8.9 - 10.3 mg/dL 8.7(L) 8.6(L) -  Total Protein 6.5 - 8.1 g/dL 10.2(H) 9.9(H) -  Total Bilirubin 0.3 - 1.2 mg/dL 0.6 0.6 -  Alkaline Phos 38 - 126 U/L 43 48 -  AST 15 - 41 U/L 26 28 -  ALT 0 - 44 U/L 13 14 -       DIAGNOSTIC IMAGING:  I reviewed his skeletal survey independently and discussed the results with him.    ASSESSMENT & PLAN:   Multiple myeloma ([employer]) 1.  Smoldering  myeloma: -Bone marrow biopsy on 01/07/2016 at [facility] shows trilineage hematopoiesis, plasma cells 13%.  FISH panel showed a additional IGH/14 q. signal.  Gain of chromosome 9 and CCN D1/11 q. chromosome analysis shows 17, XY. -Last PET CT scan in January 2018 did not reveal any suspicious lesions for myeloma.  He denies any new onset bone pains.  - I have reviewed his labs.  His M spike on 07/13/2018 was 4.1 g/dL.  Kappa light chains were 165, free light chain ratio of 12.34.  Creatinine 1 has improved to 1.29.  Calcium was 8.7. - I have independently reviewed his bone survey dated 04/22/2018 which was negative for lytic lesions. -We will continue close follow-ups with 31-monthlabs.  2.  Mediastinal adenopathy: - His prior PET CT scan showed mediastinal adenopathy.  He underwent CT scan of his chest on 04/13/2018 to follow-up on this.  I have reviewed the results with him.  There is no adenopathy on the current scan.      Orders placed this encounter:  Orders Placed This Encounter  Procedures  . Lactate dehydrogenase  . Protein electrophoresis, serum  . Immunofixation electrophoresis  . Kappa/lambda light chains  . CBC with Differential/Platelet  . Comprehensive metabolic  panel      SDerek Jack MD [phone]

## 2018-07-20 NOTE — Patient Instructions (Signed)
[facility] at [facility] Discharge Instructions  Follow up in 3 months with labs 1 week prior.   Thank you for choosing [facility] at [facility] to provide your oncology and hematology care.  To afford each patient quality time with our provider, please arrive at least 15 minutes before your scheduled appointment time.   If you have a lab appointment with the Cancer Center please come in thru the  Main Entrance and check in at the main information desk  You need to re-schedule your appointment should you arrive 10 or more minutes late.  We strive to give you quality time with our providers, and arriving late affects you and other patients whose appointments are after yours.  Also, if you no show three or more times for appointments you may be dismissed from the clinic at the providers discretion.     Again, thank you for choosing [facility].  Our hope is that these requests will decrease the amount of time that you wait before being seen by our physicians.       _____________________________________________________________  Should you have questions after your visit to [facility], please contact our office at [phone] between the hours of 8:00 a.m. and 4:30 p.m.  Voicemails left after 4:00 p.m. will not be returned until the following business day.  For prescription refill requests, have your pharmacy contact our office and allow 72 hours.    Cancer Center Support Programs:   > Cancer Support Group  2nd Tuesday of the month 1pm-2pm, Journey Room    

## 2018-07-20 NOTE — Assessment & Plan Note (Signed)
1.  Smoldering  myeloma: -Bone marrow biopsy on 01/07/2016 at [facility] shows trilineage hematopoiesis, plasma cells 13%.  FISH panel showed a additional IGH/14 q. signal.  Gain of chromosome 9 and CCN D1/11 q. chromosome analysis shows 24, XY. -Last PET CT scan in January 2018 did not reveal any suspicious lesions for myeloma.  He denies any new onset bone pains.  - I have reviewed his labs.  His M spike on 07/13/2018 was 4.1 g/dL.  Kappa light chains were 165, free light chain ratio of 12.34.  Creatinine 1 has improved to 1.29.  Calcium was 8.7. - I have independently reviewed his bone survey dated 04/22/2018 which was negative for lytic lesions. -We will continue close follow-ups with 76-monthlabs.  2.  Mediastinal adenopathy: - His prior PET CT scan showed mediastinal adenopathy.  He underwent CT scan of his chest on 04/13/2018 to follow-up on this.  I have reviewed the results with him.  There is no adenopathy on the current scan.

## 2018-08-09 ENCOUNTER — Telehealth (HOSPITAL_COMMUNITY): Payer: Self-pay | Admitting: Internal Medicine

## 2018-08-09 NOTE — Telephone Encounter (Signed)
call from PCP regarding new blood clot in pt.Pt also reportedly on coumadin.Instructed him to begin lovenox dosed at 1 mg/kg bid for now.Pt will be set up to see Dr. Worthy Keeler for follow-up.

## 2018-08-17 ENCOUNTER — Ambulatory Visit (HOSPITAL_COMMUNITY): Payer: Medicare FFS | Admitting: Hematology

## 2018-08-21 ENCOUNTER — Other Ambulatory Visit: Payer: Self-pay

## 2018-08-21 ENCOUNTER — Inpatient Hospital Stay (HOSPITAL_COMMUNITY): Payer: Medicare FFS | Attending: Hematology | Admitting: Hematology

## 2018-08-21 ENCOUNTER — Encounter (HOSPITAL_COMMUNITY): Payer: Self-pay | Admitting: Hematology

## 2018-08-21 VITALS — BP 140/64 | HR 60 | Temp 99.0°F | Resp 18 | Wt 187.8 lb

## 2018-08-21 DIAGNOSIS — C9000 Multiple myeloma not having achieved remission: Secondary | ICD-10-CM | POA: Insufficient documentation

## 2018-08-21 DIAGNOSIS — I82432 Acute embolism and thrombosis of left popliteal vein: Secondary | ICD-10-CM | POA: Diagnosis present

## 2018-08-21 DIAGNOSIS — R590 Localized enlarged lymph nodes: Secondary | ICD-10-CM | POA: Insufficient documentation

## 2018-08-21 DIAGNOSIS — I82412 Acute embolism and thrombosis of left femoral vein: Secondary | ICD-10-CM | POA: Insufficient documentation

## 2018-08-21 DIAGNOSIS — Z7901 Long term (current) use of anticoagulants: Secondary | ICD-10-CM | POA: Diagnosis not present

## 2018-08-21 MED ORDER — RIVAROXABAN (XARELTO) VTE STARTER PACK (15 & 20 MG)
ORAL_TABLET | ORAL | 0 refills | Status: DC
Start: 2018-08-21 — End: 2018-08-22

## 2018-08-21 NOTE — Assessment & Plan Note (Signed)
1.  Recurrent left leg DVT: - A Doppler on 08/09/2018 showed positive acute thrombus in the left popliteal and femoral vein. -Patient has been on Coumadin for the past 13 years when he had pulmonary embolism in 2006.  The new clot happened while he is on Coumadin.  I do not know if the INR was therapeutic at that time. - He was started on Lovenox 100 mg twice daily.  Patient reportedly finished Lovenox yesterday. - I have recommended indefinite anticoagulation with 1 of the newer agents.  I have given a prescription for Xarelto starter pack to be taken with food.  I will reevaluate him in 4 weeks.  2.  Smoldering  myeloma: -Bone marrow biopsy on 01/07/2016 at [facility] shows trilineage hematopoiesis, plasma cells 13%.  FISH panel showed a additional IGH/14 q. signal.  Gain of chromosome 9 and CCN D1/11 q. chromosome analysis shows 69, XY. -Last PET CT scan in January 2018 did not reveal any suspicious lesions for myeloma.  He denies any new onset bone pains.  - M spike on 07/13/2018 was 4.1 g/dL.  Kappa light chains were 165, light chain ratio of 12.34.  Creatinine was 1.29 and calcium 8.7. -Skeletal survey on 04/22/2018 was negative for lytic lesions. -He will come back in 3 months for follow-up with repeat labs.  3.  Mediastinal adenopathy: - PET/CT in 2018 showed mediastinal adenopathy. -CT scan of the chest on 04/13/2018 to follow-up on lymphadenopathy showed complete resolution.

## 2018-08-21 NOTE — Progress Notes (Signed)
[place], [place] [address]   CLINIC:  Medical Oncology/Hematology  PCP:  Neale Burly, MD [place] [id number] [phone]   REASON FOR VISIT: Follow-up for smoldering myeloma  CURRENT THERAPY: Observation   INTERVAL HISTORY:  Dale Suarez 84 y.o. male returns for unscheduled visit because of new problem.  A Doppler on 08/09/2018 at [facility] showed positive for left popliteal and femoral vein acute DVT.  Patient reportedly was on Coumadin at that time.  However I do not know if the INR was therapeutic.  He was started on Lovenox 100 mg twice daily.  He did not have any major bleeding with Lovenox.  He has been on Coumadin since 2006 for pulmonary embolism.  Denies any pleuritic type of chest pain.    REVIEW OF SYSTEMS:  Review of Systems  Neurological: Positive for extremity weakness.  Hematological: Bruises/bleeds easily.  All other systems reviewed and are negative.    PAST MEDICAL/SURGICAL HISTORY:  Past Medical History:  Diagnosis Date  . Anemia   . Glaucoma   . Hypertension    History reviewed. No pertinent surgical history.   SOCIAL HISTORY:  Social History   Socioeconomic History  . Marital status: Widowed    Spouse name: Not on file  . Number of children: Not on file  . Years of education: Not on file  . Highest education level: Not on file  Occupational History  . Not on file  Social Needs  . Financial resource strain: Not on file  . Food insecurity:    Worry: Not on file    Inability: Not on file  . Transportation needs:    Medical: Not on file    Non-medical: Not on file  Tobacco Use  . Smoking status: Never Smoker  . Smokeless tobacco: Never Used  Substance and Sexual Activity  . Alcohol use: No  . Drug use: No  . Sexual activity: Not Currently  Lifestyle  . Physical activity:    Days per week: Not on file    Minutes per session: Not on file  . Stress: Not on file    Relationships  . Social connections:    Talks on phone: Not on file    Gets together: Not on file    Attends religious service: Not on file    Active member of club or organization: Not on file    Attends meetings of clubs or organizations: Not on file    Relationship status: Not on file  . Intimate partner violence:    Fear of current or ex partner: Not on file    Emotionally abused: Not on file    Physically abused: Not on file    Forced sexual activity: Not on file  Other Topics Concern  . Not on file  Social History Narrative  . Not on file    FAMILY HISTORY:  Family History  Problem Relation Age of Onset  . Hypertension Mother   . Heart attack Mother   . Heart attack Father   . Stroke Sister   . Alcoholism Brother   . Cirrhosis Brother     CURRENT MEDICATIONS:  Outpatient Encounter Medications as of 08/21/2018  Medication Sig Note  . albuterol (PROVENTIL HFA;VENTOLIN HFA) 108 (90 Base) MCG/ACT inhaler Inhale into the lungs. 06/29/2016: Received from: [place]: Inhale 2 puffs into the lungs as needed for Wheezing.  Marland Kitchen aspirin EC 81 MG tablet Take  81 mg by mouth daily.   . Cholecalciferol (VITAMIN D3) 2000 units TABS Take 2,000 Units by mouth daily.  06/29/2016: Received from: [place]: Take by mouth daily.  . Cod Liver Oil OIL Take by mouth daily.  06/29/2016: Received from: [place]: Take 1 tablet by mouth daily.  . cyanocobalamin 500 MCG tablet Take 500 mcg by mouth daily.   . dorzolamide (TRUSOPT) 2 % ophthalmic solution INSTILL ONE DROP IN [facility] EYE TWICE DAILY   . doxazosin (CARDURA) 8 MG tablet Take 8 mg by mouth daily.  06/29/2016: Received from: [place]: Take 8 mg by mouth at bedtime.  . enoxaparin (LOVENOX) 100 MG/ML injection INJECT ONE SYRINGE TWICE DAILY AS DIRECTED   . Ferrous Gluconate (IRON 27 PO) Take 1 tablet every morning by mouth.   . Glucosamine Sulfate 1000 MG CAPS Take by mouth. Takes  twice a day 06/29/2016: Received from: [place]: Take 1 capsule by mouth daily.  . hydrochlorothiazide (HYDRODIURIL) 50 MG tablet Take 50 mg by mouth daily.  06/29/2016: Received from: [place]: Take 50 mg by mouth daily.  Marland Kitchen latanoprost (XALATAN) 0.005 % ophthalmic solution Place 1 drop into both eyes at bedtime. 06/29/2016: Received from: [place]  . loratadine (CLARITIN) 10 MG tablet Take 10 mg by mouth daily.  06/29/2016: Received from: [place]: Take 10 mg by mouth as needed for Allergies.  . Rivaroxaban 15 & 20 MG TBPK Take as directed on package: Start with one 31m tablet by mouth twice a day with food. On Day 22, switch to one 24mtablet once a day with food.   . Sildenafil Citrate (VIAGRA PO) Take by mouth.   . warfarin (COUMADIN) 5 MG tablet Take 5 mg by mouth one time only at 6 PM.  06/29/2016: Received from: [place] mg by mouth daily.   No facility-administered encounter medications on file as of 08/21/2018.     ALLERGIES:  No Known Allergies   PHYSICAL EXAM:  ECOG Performance status: 1  Vitals:   08/21/18 1423  BP: 140/64  Pulse: 60  Resp: 18  Temp: 99 F (37.2 C)  SpO2: 100%   Filed Weights   08/21/18 1423  Weight: 187 lb 12.8 oz (85.2 kg)    Physical Exam  Constitutional: He is oriented to person, place, and time. He appears well-developed and well-nourished.  Cardiovascular: Normal rate, regular rhythm and normal heart sounds.  Pulmonary/Chest: Effort normal and breath sounds normal.  Musculoskeletal: Normal range of motion.  Neurological: He is alert and oriented to person, place, and time.  Skin: Skin is warm and dry.  Psychiatric: He has a normal mood and affect. His behavior is normal. Judgment and thought content normal.  Extremities: Left leg larger than right leg.   LABORATORY DATA:  I have reviewed the labs as listed.  CBC    Component Value Date/Time   WBC 3.9 (L)  07/13/2018 1347   RBC 3.85 (L) 07/13/2018 1347   HGB 12.7 (L) 07/13/2018 1347   HCT 39.0 07/13/2018 1347   PLT 156 07/13/2018 1347   MCV 101.3 (H) 07/13/2018 1347   MCH 33.0 07/13/2018 1347   MCHC 32.6 07/13/2018 1347   RDW 14.7 07/13/2018 1347   LYMPHSABS 1.4 07/13/2018 1347   MONOABS 0.6 07/13/2018 1347   EOSABS 0.1 07/13/2018 1347   BASOSABS 0.0 07/13/2018 1347   CMP Latest Ref Rng & Units  07/13/2018 04/19/2018 04/13/2018  Glucose 70 - 99 mg/dL 69(L) 90 -  BUN 8 - 23 mg/dL 17 20 -  Creatinine 0.61 - 1.24 mg/dL 1.29(H) 1.39(H) 1.10  Sodium 135 - 145 mmol/L 134(L) 134(L) -  Potassium 3.5 - 5.1 mmol/L 3.8 3.9 -  Chloride 98 - 111 mmol/L 107 107 -  CO2 22 - 32 mmol/L 25 24 -  Calcium 8.9 - 10.3 mg/dL 8.7(L) 8.6(L) -  Total Protein 6.5 - 8.1 g/dL 10.2(H) 9.9(H) -  Total Bilirubin 0.3 - 1.2 mg/dL 0.6 0.6 -  Alkaline Phos 38 - 126 U/L 43 48 -  AST 15 - 41 U/L 26 28 -  ALT 0 - 44 U/L 13 14 -       DIAGNOSTIC IMAGING:  I have independently reviewed Doppler dated 08/09/2018 which shows left popliteal and femoral vein acute thrombus.    ASSESSMENT & PLAN:   Acute deep vein thrombosis (DVT) of femoral vein of left lower extremity (HCC) 1.  Recurrent left leg DVT: - A Doppler on 08/09/2018 showed positive acute thrombus in the left popliteal and femoral vein. -Patient has been on Coumadin for the past 13 years when he had pulmonary embolism in 2006.  The new clot happened while he is on Coumadin.  I do not know if the INR was therapeutic at that time. - He was started on Lovenox 100 mg twice daily.  Patient reportedly finished Lovenox yesterday. - I have recommended indefinite anticoagulation with 1 of the newer agents.  I have given a prescription for Xarelto starter pack to be taken with food.  I will reevaluate him in 4 weeks.  2.  Smoldering  myeloma: -Bone marrow biopsy on 01/07/2016 at [facility] shows trilineage hematopoiesis, plasma cells 13%.  FISH panel showed a  additional IGH/14 q. signal.  Gain of chromosome 9 and CCN D1/11 q. chromosome analysis shows 24, XY. -Last PET CT scan in January 2018 did not reveal any suspicious lesions for myeloma.  He denies any new onset bone pains.  - M spike on 07/13/2018 was 4.1 g/dL.  Kappa light chains were 165, light chain ratio of 12.34.  Creatinine was 1.29 and calcium 8.7. -Skeletal survey on 04/22/2018 was negative for lytic lesions. -He will come back in 3 months for follow-up with repeat labs.  3.  Mediastinal adenopathy: - PET/CT in 2018 showed mediastinal adenopathy. -CT scan of the chest on 04/13/2018 to follow-up on lymphadenopathy showed complete resolution.       Orders placed this encounter:  No orders of the defined types were placed in this encounter.     Derek Jack, MD [place] [phone]

## 2018-08-22 ENCOUNTER — Telehealth (HOSPITAL_COMMUNITY): Payer: Self-pay | Admitting: Hematology

## 2018-08-22 ENCOUNTER — Other Ambulatory Visit (HOSPITAL_COMMUNITY): Payer: Self-pay | Admitting: *Deleted

## 2018-08-22 DIAGNOSIS — I82412 Acute embolism and thrombosis of left femoral vein: Secondary | ICD-10-CM

## 2018-08-22 MED ORDER — RIVAROXABAN (XARELTO) VTE STARTER PACK (15 & 20 MG): ORAL_TABLET | ORAL | 0 refills | Status: DC

## 2018-08-22 NOTE — Telephone Encounter (Signed)
Faxed Rivaroxanban 15 and 20 mg script to [facility]

## 2018-09-25 ENCOUNTER — Encounter (HOSPITAL_COMMUNITY): Payer: Self-pay | Admitting: Hematology

## 2018-09-25 ENCOUNTER — Inpatient Hospital Stay (HOSPITAL_COMMUNITY): Payer: Medicare FFS | Attending: Hematology | Admitting: Hematology

## 2018-09-25 ENCOUNTER — Other Ambulatory Visit: Payer: Self-pay

## 2018-09-25 VITALS — BP 137/58 | HR 50 | Temp 98.2°F | Resp 20 | Wt 181.3 lb

## 2018-09-25 DIAGNOSIS — I82412 Acute embolism and thrombosis of left femoral vein: Secondary | ICD-10-CM | POA: Diagnosis present

## 2018-09-25 DIAGNOSIS — R590 Localized enlarged lymph nodes: Secondary | ICD-10-CM | POA: Diagnosis not present

## 2018-09-25 DIAGNOSIS — I82432 Acute embolism and thrombosis of left popliteal vein: Secondary | ICD-10-CM

## 2018-09-25 DIAGNOSIS — D472 Monoclonal gammopathy: Secondary | ICD-10-CM | POA: Diagnosis not present

## 2018-09-25 DIAGNOSIS — Z7901 Long term (current) use of anticoagulants: Secondary | ICD-10-CM

## 2018-09-25 DIAGNOSIS — C9000 Multiple myeloma not having achieved remission: Secondary | ICD-10-CM

## 2018-09-25 MED ORDER — RIVAROXABAN 20 MG PO TABS: 20.0000 mg | ORAL_TABLET | Freq: Every day | ORAL | 3 refills | Status: DC

## 2018-09-25 NOTE — Progress Notes (Signed)
[place], [place] [address]   CLINIC:  Medical Oncology/Hematology  PCP:  Neale Burly, MD [place] [id number] [phone]   REASON FOR VISIT: Follow-up for DVT  CURRENT THERAPY: observation   INTERVAL HISTORY:  [name] 84 y.o. male returns for routine follow-up for DVT. He is here today and doing well. He has taken his xarelto as prescribed and just ran out a day or two ago. A new prescription has been sent to his pharmacy. He feels he has become weaker since starting the medication but also claims he isn't eating as good now. He will try and increase his food intake.  He denies any bleeding or easy bruising. He has occasional headaches that resolve with medication. He has financial concerns about continuing on the medication long term.    REVIEW OF SYSTEMS:  Review of Systems  Constitutional: Positive for fatigue.  Cardiovascular: Positive for leg swelling.  Neurological: Positive for extremity weakness, headaches and numbness.  All other systems reviewed and are negative.    PAST MEDICAL/SURGICAL HISTORY:  Past Medical History:  Diagnosis Date  . Anemia   . Glaucoma   . Hypertension    History reviewed. No pertinent surgical history.   SOCIAL HISTORY:  Social History   Socioeconomic History  . Marital status: Widowed    Spouse name: Not on file  . Number of children: Not on file  . Years of education: Not on file  . Highest education level: Not on file  Occupational History  . Not on file  Social Needs  . Financial resource strain: Not on file  . Food insecurity:    Worry: Not on file    Inability: Not on file  . Transportation needs:    Medical: Not on file    Non-medical: Not on file  Tobacco Use  . Smoking status: Never Smoker  . Smokeless tobacco: Never Used  Substance and Sexual Activity  . Alcohol use: No  . Drug use: No  . Sexual activity: Not Currently  Lifestyle  .  Physical activity:    Days per week: Not on file    Minutes per session: Not on file  . Stress: Not on file  Relationships  . Social connections:    Talks on phone: Not on file    Gets together: Not on file    Attends religious service: Not on file    Active member of club or organization: Not on file    Attends meetings of clubs or organizations: Not on file    Relationship status: Not on file  . Intimate partner violence:    Fear of current or ex partner: Not on file    Emotionally abused: Not on file    Physically abused: Not on file    Forced sexual activity: Not on file  Other Topics Concern  . Not on file  Social History Narrative  . Not on file    FAMILY HISTORY:  Family History  Problem Relation Age of Onset  . Hypertension Mother   . Heart attack Mother   . Heart attack Father   . Stroke Sister   . Alcoholism Brother   . Cirrhosis Brother     CURRENT MEDICATIONS:  Outpatient Encounter Medications as of 09/25/2018  Medication Sig Note  . albuterol (PROVENTIL HFA;VENTOLIN HFA) 108 (90 Base) MCG/ACT inhaler Inhale into the lungs. 06/29/2016: Received from: [place]: Inhale 2 puffs  into the lungs as needed for Wheezing.  Marland Kitchen aspirin EC 81 MG tablet Take 81 mg by mouth daily.   . Cholecalciferol (VITAMIN D3) 2000 units TABS Take 2,000 Units by mouth daily.    . Cod Liver Oil OIL Take by mouth daily.    . cyanocobalamin 500 MCG tablet Take 500 mcg by mouth daily.   . dorzolamide (TRUSOPT) 2 % ophthalmic solution INSTILL ONE DROP IN [facility] EYE TWICE DAILY   . doxazosin (CARDURA) 8 MG tablet Take 8 mg by mouth daily.    . Ferrous Gluconate (IRON 27 PO) Take 1 tablet every morning by mouth.   . Glucosamine Sulfate 1000 MG CAPS Take by mouth. Takes twice a day   . hydrochlorothiazide (HYDRODIURIL) 50 MG tablet Take 50 mg by mouth daily.    Marland Kitchen latanoprost (XALATAN) 0.005 % ophthalmic solution Place 1 drop into both eyes at bedtime.   Marland Kitchen loratadine (CLARITIN) 10  MG tablet Take 10 mg by mouth daily.    . Rivaroxaban 15 & 20 MG TBPK Take as directed on package: Start with one 1m tablet by mouth twice a day with food. On Day 22, switch to one 25mtablet once a day with food.   . Sildenafil Citrate (VIAGRA PO) Take by mouth.   . rivaroxaban (XARELTO) 20 MG TABS tablet Take 1 tablet (20 mg total) by mouth daily with supper.   . [DISCONTINUED] enoxaparin (LOVENOX) 100 MG/ML injection INJECT ONE SYRINGE TWICE DAILY AS DIRECTED   . [DISCONTINUED] warfarin (COUMADIN) 5 MG tablet Take 5 mg by mouth one time only at 6 PM.  06/29/2016: Received from: [place] mg by mouth daily.   No facility-administered encounter medications on file as of 09/25/2018.     ALLERGIES:  No Known Allergies   PHYSICAL EXAM:  ECOG Performance status: 1  Vitals:   09/25/18 1458  BP: (!) 137/58  Pulse: (!) 50  Resp: 20  Temp: 98.2 F (36.8 C)  SpO2: 100%   Filed Weights   09/25/18 1458  Weight: 181 lb 4.8 oz (82.2 kg)    Physical Exam  Constitutional: He is oriented to person, place, and time. He appears well-developed and well-nourished.  Musculoskeletal: Normal range of motion.  Neurological: He is alert and oriented to person, place, and time.  Skin: Skin is warm and dry.  Psychiatric: He has a normal mood and affect. His behavior is normal. Judgment and thought content normal.     LABORATORY DATA:  I have reviewed the labs as listed.  CBC    Component Value Date/Time   WBC 3.9 (L) 07/13/2018 1347   RBC 3.85 (L) 07/13/2018 1347   HGB 12.7 (L) 07/13/2018 1347   HCT 39.0 07/13/2018 1347   PLT 156 07/13/2018 1347   MCV 101.3 (H) 07/13/2018 1347   MCH 33.0 07/13/2018 1347   MCHC 32.6 07/13/2018 1347   RDW 14.7 07/13/2018 1347   LYMPHSABS 1.4 07/13/2018 1347   MONOABS 0.6 07/13/2018 1347   EOSABS 0.1 07/13/2018 1347   BASOSABS 0.0 07/13/2018 1347   CMP Latest Ref Rng & Units 07/13/2018 04/19/2018 04/13/2018  Glucose 70 - 99 mg/dL  69(L) 90 -  BUN 8 - 23 mg/dL 17 20 -  Creatinine 0.61 - 1.24 mg/dL 1.29(H) 1.39(H) 1.10  Sodium 135 - 145 mmol/L 134(L) 134(L) -  Potassium 3.5 - 5.1 mmol/L 3.8 3.9 -  Chloride 98 - 111 mmol/L 107 107 -  CO2 22 - 32 mmol/L  25 24 -  Calcium 8.9 - 10.3 mg/dL 8.7(L) 8.6(L) -  Total Protein 6.5 - 8.1 g/dL 10.2(H) 9.9(H) -  Total Bilirubin 0.3 - 1.2 mg/dL 0.6 0.6 -  Alkaline Phos 38 - 126 U/L 43 48 -  AST 15 - 41 U/L 26 28 -  ALT 0 - 44 U/L 13 14 -      I have reviewed Francene Finders, NP's note and agree with the documentation.  I personally performed a face-to-face visit, made revisions and my assessment and plan is as follows.      ASSESSMENT & PLAN:   Multiple myeloma ([place]) 1.  Recurrent left leg DVT: - A Doppler on 08/09/2018 showed positive acute thrombus in the left popliteal and femoral vein. -Patient has been on Coumadin for the past 13 years when he had pulmonary embolism in 2006.  The new clot happened while he is on Coumadin.  I do not know if the INR was therapeutic at that time. - He was started on Lovenox bridge, followed by Xarelto starter pack on 07/20/2018.  I have recommended indefinite anticoagulation. -He tolerated starter pack very well.  He might have noticed some tiredness when he was taking 20 mg tablet.  But at the same time he reports that he has not been eating well. - We have prescribed him 20 mg tablet once a day with food.  We will reevaluate him end of this month and see how he is doing.  He is concerned about the donut hole and increased co-pay.  We will try to get him financial assistance if he gets into that situation.  2.  Smoldering  myeloma: -Bone marrow biopsy on 01/07/2016 at [facility] shows trilineage hematopoiesis, plasma cells 13%.  FISH panel showed a additional IGH/14 q. signal.  Gain of chromosome 9 and CCN D1/11 q. chromosome analysis shows 56, XY. -Last PET CT scan in January 2018 did not reveal any suspicious lesions for myeloma.  He  denies any new onset bone pains.  - M spike on 07/13/2018 was 4.1 g/dL.  Kappa light chains were 165, light chain ratio of 12.34.  Creatinine was 1.29 and calcium 8.7. -Skeletal survey on 04/22/2018 was negative for lytic lesions. -He will come back in 3 months for follow-up with repeat labs.  3.  Mediastinal adenopathy: - PET/CT in 2018 showed mediastinal adenopathy. -CT scan of the chest on 04/13/2018 to follow-up on lymphadenopathy showed complete resolution.       Orders placed this encounter:  No orders of the defined types were placed in this encounter.     Derek Jack, MD [place] [phone]

## 2018-09-25 NOTE — Assessment & Plan Note (Signed)
1.  Recurrent left leg DVT: - A Doppler on 08/09/2018 showed positive acute thrombus in the left popliteal and femoral vein. -Patient has been on Coumadin for the past 13 years when he had pulmonary embolism in 2006.  The new clot happened while he is on Coumadin.  I do not know if the INR was therapeutic at that time. - He was started on Lovenox bridge, followed by Xarelto starter pack on 07/20/2018.  I have recommended indefinite anticoagulation. -He tolerated starter pack very well.  He might have noticed some tiredness when he was taking 20 mg tablet.  But at the same time he reports that he has not been eating well. - We have prescribed him 20 mg tablet once a day with food.  We will reevaluate him end of this month and see how he is doing.  He is concerned about the donut hole and increased co-pay.  We will try to get him financial assistance if he gets into that situation.  2.  Smoldering  myeloma: -Bone marrow biopsy on 01/07/2016 at [facility] shows trilineage hematopoiesis, plasma cells 13%.  FISH panel showed a additional IGH/14 q. signal.  Gain of chromosome 9 and CCN D1/11 q. chromosome analysis shows 23, XY. -Last PET CT scan in January 2018 did not reveal any suspicious lesions for myeloma.  He denies any new onset bone pains.  - M spike on 07/13/2018 was 4.1 g/dL.  Kappa light chains were 165, light chain ratio of 12.34.  Creatinine was 1.29 and calcium 8.7. -Skeletal survey on 04/22/2018 was negative for lytic lesions. -He will come back in 3 months for follow-up with repeat labs.  3.  Mediastinal adenopathy: - PET/CT in 2018 showed mediastinal adenopathy. -CT scan of the chest on 04/13/2018 to follow-up on lymphadenopathy showed complete resolution.

## 2018-09-25 NOTE — Patient Instructions (Signed)
[facility] at [facility] Discharge Instructions     Thank you for choosing [facility] at [facility] to provide your oncology and hematology care.  To afford each patient quality time with our provider, please arrive at least 15 minutes before your scheduled appointment time.   If you have a lab appointment with the Cancer Center please come in thru the  Main Entrance and check in at the main information desk  You need to re-schedule your appointment should you arrive 10 or more minutes late.  We strive to give you quality time with our providers, and arriving late affects you and other patients whose appointments are after yours.  Also, if you no show three or more times for appointments you may be dismissed from the clinic at the providers discretion.     Again, thank you for choosing [facility].  Our hope is that these requests will decrease the amount of time that you wait before being seen by our physicians.       _____________________________________________________________  Should you have questions after your visit to [facility], please contact our office at [phone] between the hours of 8:00 a.m. and 4:30 p.m.  Voicemails left after 4:00 p.m. will not be returned until the following business day.  For prescription refill requests, have your pharmacy contact our office and allow 72 hours.    Cancer Center Support Programs:   > Cancer Support Group  2nd Tuesday of the month 1pm-2pm, Journey Room    

## 2018-10-05 ENCOUNTER — Inpatient Hospital Stay (HOSPITAL_COMMUNITY): Payer: Medicare FFS

## 2018-10-05 DIAGNOSIS — D472 Monoclonal gammopathy: Secondary | ICD-10-CM

## 2018-10-05 DIAGNOSIS — C9000 Multiple myeloma not having achieved remission: Secondary | ICD-10-CM

## 2018-10-05 DIAGNOSIS — I82432 Acute embolism and thrombosis of left popliteal vein: Secondary | ICD-10-CM | POA: Diagnosis not present

## 2018-10-05 LAB — CBC WITH DIFFERENTIAL/PLATELET
ABS IMMATURE GRANULOCYTES: 0 10*3/uL (ref 0.00–0.07)
BASOS ABS: 0 10*3/uL (ref 0.0–0.1)
Basophils Relative: 1 %
Eosinophils Absolute: 0 10*3/uL (ref 0.0–0.5)
Eosinophils Relative: 1 %
HEMATOCRIT: 38.2 % — AB (ref 39.0–52.0)
Hemoglobin: 11.7 g/dL — ABNORMAL LOW (ref 13.0–17.0)
Immature Granulocytes: 0 %
Lymphocytes Relative: 36 %
Lymphs Abs: 1.1 10*3/uL (ref 0.7–4.0)
MCH: 32 pg (ref 26.0–34.0)
MCHC: 30.6 g/dL (ref 30.0–36.0)
MCV: 104.4 fL — AB (ref 80.0–100.0)
MONOS PCT: 15 %
Monocytes Absolute: 0.4 10*3/uL (ref 0.1–1.0)
Neutro Abs: 1.4 10*3/uL — ABNORMAL LOW (ref 1.7–7.7)
Neutrophils Relative %: 47 %
PLATELETS: 157 10*3/uL (ref 150–400)
RBC: 3.66 MIL/uL — AB (ref 4.22–5.81)
RDW: 14.8 % (ref 11.5–15.5)
WBC: 2.9 10*3/uL — ABNORMAL LOW (ref 4.0–10.5)
nRBC: 0 % (ref 0.0–0.2)

## 2018-10-05 LAB — COMPREHENSIVE METABOLIC PANEL
ALT: 10 U/L (ref 0–44)
ANION GAP: 3 — AB (ref 5–15)
AST: 22 U/L (ref 15–41)
Albumin: 3.1 g/dL — ABNORMAL LOW (ref 3.5–5.0)
Alkaline Phosphatase: 37 U/L — ABNORMAL LOW (ref 38–126)
BUN: 12 mg/dL (ref 8–23)
CALCIUM: 8.9 mg/dL (ref 8.9–10.3)
CHLORIDE: 104 mmol/L (ref 98–111)
CO2: 26 mmol/L (ref 22–32)
Creatinine, Ser: 1.34 mg/dL — ABNORMAL HIGH (ref 0.61–1.24)
GFR calc Af Amer: 56 mL/min — ABNORMAL LOW (ref >60)
GFR calc non Af Amer: 48 mL/min — ABNORMAL LOW (ref >60)
GLUCOSE: 81 mg/dL (ref 70–99)
Potassium: 3.6 mmol/L (ref 3.5–5.1)
SODIUM: 133 mmol/L — AB (ref 135–145)
Total Bilirubin: 0.6 mg/dL (ref 0.3–1.2)
Total Protein: 10.1 g/dL — ABNORMAL HIGH (ref 6.5–8.1)

## 2018-10-05 LAB — LACTATE DEHYDROGENASE: LDH: 151 U/L (ref 98–192)

## 2018-10-06 ENCOUNTER — Other Ambulatory Visit (HOSPITAL_COMMUNITY): Payer: Medicare FFS

## 2018-10-06 LAB — KAPPA/LAMBDA LIGHT CHAINS
KAPPA FREE LGHT CHN: 187.2 mg/L — AB (ref 3.3–19.4)
KAPPA, LAMDA LIGHT CHAIN RATIO: 16.28 — AB (ref 0.26–1.65)
LAMDA FREE LIGHT CHAINS: 11.5 mg/L (ref 5.7–26.3)

## 2018-10-09 LAB — IMMUNOFIXATION ELECTROPHORESIS
IgA: <5 mg/dL — ABNORMAL LOW (ref 61–437)
IgG (Immunoglobin G), Serum: 6064 mg/dL — ABNORMAL HIGH (ref 700–1600)
IgM (Immunoglobulin M), Srm: 11 mg/dL — ABNORMAL LOW (ref 15–143)
Total Protein ELP: 9.7 g/dL — ABNORMAL HIGH (ref 6.0–8.5)

## 2018-10-09 LAB — PROTEIN ELECTROPHORESIS, SERUM
A/G Ratio: 0.6 — ABNORMAL LOW (ref 0.7–1.7)
ALPHA-1-GLOBULIN: 0.3 g/dL (ref 0.0–0.4)
ALPHA-2-GLOBULIN: 0.6 g/dL (ref 0.4–1.0)
Albumin ELP: 3.6 g/dL (ref 2.9–4.4)
Beta Globulin: 0.9 g/dL (ref 0.7–1.3)
GLOBULIN, TOTAL: 6 g/dL — AB (ref 2.2–3.9)
Gamma Globulin: 4.2 g/dL — ABNORMAL HIGH (ref 0.4–1.8)
M-SPIKE, %: 4 g/dL — AB
Total Protein ELP: 9.6 g/dL — ABNORMAL HIGH (ref 6.0–8.5)

## 2018-10-12 ENCOUNTER — Encounter (HOSPITAL_COMMUNITY): Payer: Self-pay | Admitting: Hematology

## 2018-10-12 ENCOUNTER — Other Ambulatory Visit: Payer: Self-pay

## 2018-10-12 ENCOUNTER — Inpatient Hospital Stay (HOSPITAL_COMMUNITY): Payer: Medicare FFS | Admitting: Hematology

## 2018-10-12 VITALS — BP 137/73 | HR 107 | Temp 98.2°F | Resp 16 | Wt 179.0 lb

## 2018-10-12 DIAGNOSIS — I82432 Acute embolism and thrombosis of left popliteal vein: Secondary | ICD-10-CM

## 2018-10-12 DIAGNOSIS — I82412 Acute embolism and thrombosis of left femoral vein: Secondary | ICD-10-CM | POA: Diagnosis not present

## 2018-10-12 DIAGNOSIS — R590 Localized enlarged lymph nodes: Secondary | ICD-10-CM

## 2018-10-12 DIAGNOSIS — Z7901 Long term (current) use of anticoagulants: Secondary | ICD-10-CM

## 2018-10-12 DIAGNOSIS — D472 Monoclonal gammopathy: Secondary | ICD-10-CM

## 2018-10-12 DIAGNOSIS — C9000 Multiple myeloma not having achieved remission: Secondary | ICD-10-CM

## 2018-10-12 NOTE — Assessment & Plan Note (Signed)
1.  Recurrent left leg DVT: - A Doppler on 08/09/2018 showed positive acute thrombus in the left popliteal and femoral vein. -Patient has been on Coumadin for the past 13 years when he had pulmonary embolism in 2006.  The new clot happened while he is on Coumadin.  I do not know if the INR was therapeutic at that time. - He was started on Lovenox bridge, followed by Xarelto starter pack on 07/20/2018.  I have recommended indefinite anticoagulation. - He is continuing Xarelto 20 mg daily without any bleeding issues.  2.  Smoldering  myeloma: -Bone marrow biopsy on 01/07/2016 at [facility] shows trilineage hematopoiesis, plasma cells 13%.  FISH panel showed a additional IGH/14 q. signal.  Gain of chromosome 9 and CCN D1/11 q. chromosome analysis shows 18, XY. -Last PET CT scan in January 2018 did not reveal any suspicious lesions for myeloma.  He denies any new onset bone pains.  - Skeletal survey on 04/22/2018 was negative for lytic lesions. - We have reviewed his blood work from 10/05/2018.  Free light chain ratio was 16.28.  Kappa free light chains were 187.  M spike was 4.0.  Creatinine was 1.34.  Calcium is 8.9.  Hemoglobin was 11.7. - We will closely monitor his labs due to high risk of progression to myeloma.  I will see him back in 2 months with repeat blood work.  3.  Mediastinal adenopathy: - PET/CT in 2018 showed mediastinal adenopathy. -CT scan of the chest on 04/13/2018 to follow-up on lymphadenopathy showed complete resolution.

## 2018-10-12 NOTE — Progress Notes (Signed)
Dale Suarez, Dale Suarez   CLINIC:  Medical Oncology/Hematology  PCP:  Neale Burly, MD [address] [id number] [phone]   REASON FOR VISIT: Follow-up for DVT AND MGUS  CURRENT THERAPY: Observation for both   INTERVAL HISTORY:  Dale Suarez 84 y.o. male returns for routine follow-up for DVT and MGUS. He is here today and has been doing great. He is able to get his medications and he has had no problems taking it. He has mild joint pain but no other complaints at this time. Denies any nausea, vomiting, or diarrhea. Denies any new pains. Had not noticed any recent bleeding such as epistaxis, hematuria or hematochezia. Denies recent chest pain on exertion, shortness of breath on minimal exertion, pre-syncopal episodes, or palpitations. Denies any numbness or tingling in hands or feet. Denies any recent fevers, infections, or recent hospitalizations. He reports his appetite and energy level at.     REVIEW OF SYSTEMS:  Review of Systems  All other systems reviewed and are negative.    PAST MEDICAL/SURGICAL HISTORY:  Past Medical History:  Diagnosis Date  . Anemia   . Glaucoma   . Hypertension    History reviewed. No pertinent surgical history.   SOCIAL HISTORY:  Social History   Socioeconomic History  . Marital status: Widowed    Spouse name: Not on file  . Number of children: Not on file  . Years of education: Not on file  . Highest education level: Not on file  Occupational History  . Not on file  Social Needs  . Financial resource strain: Not on file  . Food insecurity:    Worry: Not on file    Inability: Not on file  . Transportation needs:    Medical: Not on file    Non-medical: Not on file  Tobacco Use  . Smoking status: Never Smoker  . Smokeless tobacco: Never Used  Substance and Sexual Activity  . Alcohol use: No  . Drug use: No  . Sexual activity: Not Currently  Lifestyle  . Physical  activity:    Days per week: Not on file    Minutes per session: Not on file  . Stress: Not on file  Relationships  . Social connections:    Talks on phone: Not on file    Gets together: Not on file    Attends religious service: Not on file    Active member of club or organization: Not on file    Attends meetings of clubs or organizations: Not on file    Relationship status: Not on file  . Intimate partner violence:    Fear of current or ex partner: Not on file    Emotionally abused: Not on file    Physically abused: Not on file    Forced sexual activity: Not on file  Other Topics Concern  . Not on file  Social History Narrative  . Not on file    FAMILY HISTORY:  Family History  Problem Relation Age of Onset  . Hypertension Mother   . Heart attack Mother   . Heart attack Father   . Stroke Sister   . Alcoholism Brother   . Cirrhosis Brother     CURRENT MEDICATIONS:  Outpatient Encounter Medications as of 10/12/2018  Medication Sig Note  . albuterol (PROVENTIL HFA;VENTOLIN HFA) 108 (90 Base) MCG/ACT inhaler Inhale into the lungs. 06/29/2016: Received from: [place]: Inhale 2 puffs  into the lungs as needed for Wheezing.  Marland Kitchen aspirin EC 81 MG tablet Take 81 mg by mouth daily.   . Cholecalciferol (VITAMIN D3) 2000 units TABS Take 2,000 Units by mouth daily.    . Cod Liver Oil OIL Take by mouth daily.    . cyanocobalamin 500 MCG tablet Take 500 mcg by mouth daily.   . dorzolamide (TRUSOPT) 2 % ophthalmic solution INSTILL ONE DROP IN [facility] EYE TWICE DAILY   . doxazosin (CARDURA) 8 MG tablet Take 8 mg by mouth daily.    . Ferrous Gluconate (IRON 27 PO) Take 1 tablet every morning by mouth.   . Glucosamine Sulfate 1000 MG CAPS Take by mouth. Takes twice a day   . hydrochlorothiazide (HYDRODIURIL) 50 MG tablet Take 50 mg by mouth daily.    Marland Kitchen latanoprost (XALATAN) 0.005 % ophthalmic solution Place 1 drop into both eyes at bedtime.   Marland Kitchen loratadine (CLARITIN) 10 MG  tablet Take 10 mg by mouth daily.    . rivaroxaban (XARELTO) 20 MG TABS tablet Take 1 tablet (20 mg total) by mouth daily with supper.   . Sildenafil Citrate (VIAGRA PO) Take by mouth.   . [DISCONTINUED] Rivaroxaban 15 & 20 MG TBPK Take as directed on package: Start with one 49m tablet by mouth twice a day with food. On Day 22, switch to one 262mtablet once a day with food.    No facility-administered encounter medications on file as of 10/12/2018.     ALLERGIES:  No Known Allergies   PHYSICAL EXAM:  ECOG Performance status: 1  Vitals:   10/12/18 1152  BP: 137/73  Pulse: (!) 107  Resp: 16  Temp: 98.2 F (36.8 C)  SpO2: 100%   Filed Weights   10/12/18 1152  Weight: 179 lb (81.2 kg)    Physical Exam Constitutional:      Appearance: Normal appearance. He is normal weight.  Cardiovascular:     Rate and Rhythm: Normal rate and regular rhythm.     Heart sounds: Normal heart sounds.  Pulmonary:     Effort: Pulmonary effort is normal.     Breath sounds: Normal breath sounds.  Musculoskeletal: Normal range of motion.  Skin:    General: Skin is warm and dry.  Neurological:     Mental Status: He is alert and oriented to person, place, and time. Mental status is at baseline.  Psychiatric:        Mood and Affect: Mood normal.        Behavior: Behavior normal.        Thought Content: Thought content normal.        Judgment: Judgment normal.      LABORATORY DATA:  I have reviewed the labs as listed.  CBC    Component Value Date/Time   WBC 2.9 (L) 10/05/2018 0923   RBC 3.66 (L) 10/05/2018 0923   HGB 11.7 (L) 10/05/2018 0923   HCT 38.2 (L) 10/05/2018 0923   PLT 157 10/05/2018 0923   MCV 104.4 (H) 10/05/2018 0923   MCH 32.0 10/05/2018 0923   MCHC 30.6 10/05/2018 0923   RDW 14.8 10/05/2018 0923   LYMPHSABS 1.1 10/05/2018 0923   MONOABS 0.4 10/05/2018 0923   EOSABS 0.0 10/05/2018 0923   BASOSABS 0.0 10/05/2018 0923   CMP Latest Ref Rng & Units 10/05/2018 07/13/2018  04/19/2018  Glucose 70 - 99 mg/dL 81 69(L) 90  BUN 8 - 23 mg/dL _0 Creatinine 0.61 - 1.24 mg/dL 1.34(H)  1.29(H) 1.39(H)  Sodium 135 - 145 mmol/L 133(L) 134(L) 134(L)  Potassium 3.5 - 5.1 mmol/L 3.6 3.8 3.9  Chloride 98 - 111 mmol/L 104 107 107  CO2 22 - 32 mmol/L _0 Calcium 8.9 - 10.3 mg/dL 8.9 8.7(L) 8.6(L)  Total Protein 6.5 - 8.1 g/dL 10.1(H) 10.2(H) 9.9(H)  Total Bilirubin 0.3 - 1.2 mg/dL 0.6 0.6 0.6  Alkaline Phos 38 - 126 U/L 37(L) 43 48  AST 15 - 41 U/L _1 ALT 0 - 44 U/L _2 DIAGNOSTIC IMAGING:  I have independently reviewed the scans and discussed with the patient.   I have reviewed Francene Finders, NP's note and agree with the documentation.  I personally performed a face-to-face visit, made revisions and my assessment and plan is as follows.    ASSESSMENT & PLAN:   Acute deep vein thrombosis (DVT) of femoral vein of left lower extremity (HCC) 1.  Recurrent left leg DVT: - A Doppler on 08/09/2018 showed positive acute thrombus in the left popliteal and femoral vein. -Patient has been on Coumadin for the past 13 years when he had pulmonary embolism in 2006.  The new clot happened while he is on Coumadin.  I do not know if the INR was therapeutic at that time. - He was started on Lovenox bridge, followed by Xarelto starter pack on 07/20/2018.  I have recommended indefinite anticoagulation. - He is continuing Xarelto 20 mg daily without any bleeding issues.  2.  Smoldering  myeloma: -Bone marrow biopsy on 01/07/2016 at [facility] shows trilineage hematopoiesis, plasma cells 13%.  FISH panel showed a additional IGH/14 q. signal.  Gain of chromosome 9 and CCN D1/11 q. chromosome analysis shows 18, XY. -Last PET CT scan in January 2018 did not reveal any suspicious lesions for myeloma.  He denies any new onset bone pains.  - Skeletal survey on 04/22/2018 was negative for lytic lesions. - We have reviewed his blood work from 10/05/2018.  Free  light chain ratio was 16.28.  Kappa free light chains were 187.  M spike was 4.0.  Creatinine was 1.34.  Calcium is 8.9.  Hemoglobin was 11.7. - We will closely monitor his labs due to high risk of progression to myeloma.  I will see him back in 2 months with repeat blood work.  3.  Mediastinal adenopathy: - PET/CT in 2018 showed mediastinal adenopathy. -CT scan of the chest on 04/13/2018 to follow-up on lymphadenopathy showed complete resolution.       Orders placed this encounter:  Orders Placed This Encounter  Procedures  . Lactate dehydrogenase  . Protein electrophoresis, serum  . Kappa/lambda light chains  . CBC with Differential/Platelet  . Comprehensive metabolic panel      Derek Jack, MD [place] [phone]

## 2018-10-12 NOTE — Patient Instructions (Addendum)
[place] at [facility] Discharge Instructions  Follow up in 6 weeks with labs prior   Thank you for choosing [place] at [facility] to provide your oncology and hematology care.  To afford each patient quality time with our provider, please arrive at least 15 minutes before your scheduled appointment time.   If you have a lab appointment with the [facility] please come in thru the  Main Entrance and check in at the main information desk  You need to re-schedule your appointment should you arrive 10 or more minutes late.  We strive to give you quality time with our providers, and arriving late affects you and other patients whose appointments are after yours.  Also, if you no show three or more times for appointments you may be dismissed from the clinic at the providers discretion.     Again, thank you for choosing [facility].  Our hope is that these requests will decrease the amount of time that you wait before being seen by our physicians.       _____________________________________________________________  Should you have questions after your visit to [facility], please contact our office at [phone] between the hours of 8:00 a.m. and 4:30 p.m.  Voicemails left after 4:00 p.m. will not be returned until the following business day.  For prescription refill requests, have your pharmacy contact our office and allow 72 hours.    Cancer Center Support Programs:   > Cancer Support Group  2nd Tuesday of the month 1pm-2pm, Journey Room

## 2018-10-13 ENCOUNTER — Ambulatory Visit (HOSPITAL_COMMUNITY): Payer: Medicare FFS | Admitting: Hematology

## 2018-11-16 ENCOUNTER — Inpatient Hospital Stay (HOSPITAL_COMMUNITY): Payer: Medicare FFS | Attending: Hematology

## 2018-11-16 DIAGNOSIS — R590 Localized enlarged lymph nodes: Secondary | ICD-10-CM | POA: Insufficient documentation

## 2018-11-16 DIAGNOSIS — C9000 Multiple myeloma not having achieved remission: Secondary | ICD-10-CM | POA: Diagnosis not present

## 2018-11-16 DIAGNOSIS — Z7901 Long term (current) use of anticoagulants: Secondary | ICD-10-CM | POA: Diagnosis not present

## 2018-11-16 DIAGNOSIS — Z7982 Long term (current) use of aspirin: Secondary | ICD-10-CM | POA: Insufficient documentation

## 2018-11-16 DIAGNOSIS — Z79899 Other long term (current) drug therapy: Secondary | ICD-10-CM | POA: Diagnosis not present

## 2018-11-16 DIAGNOSIS — Z86711 Personal history of pulmonary embolism: Secondary | ICD-10-CM | POA: Insufficient documentation

## 2018-11-16 DIAGNOSIS — I10 Essential (primary) hypertension: Secondary | ICD-10-CM | POA: Diagnosis not present

## 2018-11-16 DIAGNOSIS — I82412 Acute embolism and thrombosis of left femoral vein: Secondary | ICD-10-CM | POA: Diagnosis not present

## 2018-11-16 DIAGNOSIS — D472 Monoclonal gammopathy: Secondary | ICD-10-CM

## 2018-11-16 LAB — COMPREHENSIVE METABOLIC PANEL
ALBUMIN: 3 g/dL — AB (ref 3.5–5.0)
ALT: 10 U/L (ref 0–44)
ANION GAP: 3 — AB (ref 5–15)
AST: 22 U/L (ref 15–41)
Alkaline Phosphatase: 40 U/L (ref 38–126)
BILIRUBIN TOTAL: 0.3 mg/dL (ref 0.3–1.2)
BUN: 13 mg/dL (ref 8–23)
CO2: 26 mmol/L (ref 22–32)
Calcium: 8.8 mg/dL — ABNORMAL LOW (ref 8.9–10.3)
Chloride: 103 mmol/L (ref 98–111)
Creatinine, Ser: 1.25 mg/dL — ABNORMAL HIGH (ref 0.61–1.24)
GFR calc Af Amer: >60 mL/min (ref >60)
GFR calc non Af Amer: 52 mL/min — ABNORMAL LOW (ref >60)
GLUCOSE: 108 mg/dL — AB (ref 70–99)
POTASSIUM: 3.7 mmol/L (ref 3.5–5.1)
SODIUM: 132 mmol/L — AB (ref 135–145)
TOTAL PROTEIN: 10 g/dL — AB (ref 6.5–8.1)

## 2018-11-16 LAB — CBC WITH DIFFERENTIAL/PLATELET
ABS IMMATURE GRANULOCYTES: 0.01 10*3/uL (ref 0.00–0.07)
BASOS PCT: 1 %
Basophils Absolute: 0 10*3/uL (ref 0.0–0.1)
Eosinophils Absolute: 0.1 10*3/uL (ref 0.0–0.5)
Eosinophils Relative: 2 %
HEMATOCRIT: 39.2 % (ref 39.0–52.0)
HEMOGLOBIN: 12 g/dL — AB (ref 13.0–17.0)
IMMATURE GRANULOCYTES: 0 %
LYMPHS ABS: 1.2 10*3/uL (ref 0.7–4.0)
LYMPHS PCT: 36 %
MCH: 31.3 pg (ref 26.0–34.0)
MCHC: 30.6 g/dL (ref 30.0–36.0)
MCV: 102.3 fL — AB (ref 80.0–100.0)
MONO ABS: 0.4 10*3/uL (ref 0.1–1.0)
MONOS PCT: 13 %
NEUTROS ABS: 1.6 10*3/uL — AB (ref 1.7–7.7)
NEUTROS PCT: 48 %
PLATELETS: 159 10*3/uL (ref 150–400)
RBC: 3.83 MIL/uL — ABNORMAL LOW (ref 4.22–5.81)
RDW: 14.1 % (ref 11.5–15.5)
WBC: 3.3 10*3/uL — ABNORMAL LOW (ref 4.0–10.5)
nRBC: 0 % (ref 0.0–0.2)

## 2018-11-16 LAB — LACTATE DEHYDROGENASE: LDH: 162 U/L (ref 98–192)

## 2018-11-17 ENCOUNTER — Other Ambulatory Visit (HOSPITAL_COMMUNITY): Payer: Medicare FFS

## 2018-11-17 LAB — PROTEIN ELECTROPHORESIS, SERUM
A/G RATIO SPE: 0.6 — AB (ref 0.7–1.7)
ALBUMIN ELP: 3.7 g/dL (ref 2.9–4.4)
Alpha-1-Globulin: 0.2 g/dL (ref 0.0–0.4)
Alpha-2-Globulin: 0.6 g/dL (ref 0.4–1.0)
Beta Globulin: 0.9 g/dL (ref 0.7–1.3)
GLOBULIN, TOTAL: 6.2 g/dL — AB (ref 2.2–3.9)
Gamma Globulin: 4.5 g/dL — ABNORMAL HIGH (ref 0.4–1.8)
M-Spike, %: 4.3 g/dL — ABNORMAL HIGH
TOTAL PROTEIN ELP: 9.9 g/dL — AB (ref 6.0–8.5)

## 2018-11-17 LAB — KAPPA/LAMBDA LIGHT CHAINS
KAPPA, LAMDA LIGHT CHAIN RATIO: 12.32 — AB (ref 0.26–1.65)
Kappa free light chain: 157.7 mg/L — ABNORMAL HIGH (ref 3.3–19.4)
LAMDA FREE LIGHT CHAINS: 12.8 mg/L (ref 5.7–26.3)

## 2018-11-24 ENCOUNTER — Encounter (HOSPITAL_COMMUNITY): Payer: Self-pay | Admitting: Hematology

## 2018-11-24 ENCOUNTER — Inpatient Hospital Stay (HOSPITAL_COMMUNITY): Payer: Medicare FFS | Admitting: Hematology

## 2018-11-24 VITALS — BP 128/84 | HR 45 | Temp 98.4°F | Resp 16 | Wt 183.0 lb

## 2018-11-24 DIAGNOSIS — I82412 Acute embolism and thrombosis of left femoral vein: Secondary | ICD-10-CM

## 2018-11-24 DIAGNOSIS — Z86711 Personal history of pulmonary embolism: Secondary | ICD-10-CM

## 2018-11-24 DIAGNOSIS — I10 Essential (primary) hypertension: Secondary | ICD-10-CM

## 2018-11-24 DIAGNOSIS — D472 Monoclonal gammopathy: Secondary | ICD-10-CM

## 2018-11-24 DIAGNOSIS — R590 Localized enlarged lymph nodes: Secondary | ICD-10-CM

## 2018-11-24 DIAGNOSIS — C9000 Multiple myeloma not having achieved remission: Secondary | ICD-10-CM | POA: Diagnosis not present

## 2018-11-24 DIAGNOSIS — Z79899 Other long term (current) drug therapy: Secondary | ICD-10-CM

## 2018-11-24 DIAGNOSIS — Z7982 Long term (current) use of aspirin: Secondary | ICD-10-CM

## 2018-11-24 DIAGNOSIS — Z7901 Long term (current) use of anticoagulants: Secondary | ICD-10-CM

## 2018-11-24 MED ORDER — APIXABAN 5 MG PO TABS: 5.0000 mg | ORAL_TABLET | Freq: Two times a day (BID) | ORAL | 3 refills | Status: DC

## 2018-11-24 NOTE — Assessment & Plan Note (Addendum)
1.  Recurrent left leg DVT: - A Doppler on 08/09/2018 showed positive acute thrombus in the left popliteal and femoral vein. -Patient has been on Coumadin for the past 13 years when he had pulmonary embolism in 2006.  The new clot happened while he is on Coumadin.  I do not know if the INR was therapeutic at that time. - He was started on Xarelto on 07/20/2018.  He was recommended indefinite anticoagulation. -Patient reports having a nosebleed lasting about 20 minutes about 2 weeks ago.  Reportedly it was spontaneous. - He wanted to change anticoagulation from Xarelto to Eliquis.  His brother apparently takes Eliquis and does not have any problems. -We have sent a prescription for Eliquis 5 mg twice daily to be taken starting tomorrow morning.  He already took his dose of Xarelto today.  He was told to discontinue Xarelto starting tomorrow.  2.  Smoldering  myeloma: -Bone marrow biopsy on 01/07/2016 at [facility] shows trilineage hematopoiesis, plasma cells 13%.  FISH panel showed a additional IGH/14 q. signal.  Gain of chromosome 9 and CCN D1/11 q. chromosome analysis shows 32, XY. -Last PET CT scan in January 2018 did not reveal any suspicious lesions for myeloma.  He denies any new onset bone pains.  - Skeletal survey on 04/22/2018 was negative for lytic lesions. - Myeloma panel from 11/16/2018 shows light chain ratio of 12.32, down from 16.28.  Free kappa light chains are 157, down from 187.  However M spike went up 4.3 g/dL from 4.0 in December 2019.  However his calcium and creatinine were at his baseline.  Hemoglobin was 12. -I have recommended close monitoring as his M spike is going up.  We will recheck his blood counts in 2 months.  I have also recommended checking a skeletal survey.  3.  Mediastinal adenopathy: - PET/CT in 2018 showed mediastinal adenopathy. -CT scan of the chest on 04/13/2018 to follow-up on lymphadenopathy showed complete resolution.

## 2018-11-24 NOTE — Progress Notes (Signed)
Dale Suarez, [place] [id number]   CLINIC:  Medical Oncology/Hematology  PCP:  Neale Burly, MD [place] [id number] [phone]   REASON FOR VISIT: Follow-up for DVT AND smoldering myeloma  CURRENT THERAPY: Eliquis and observation   INTERVAL HISTORY:  Dale Suarez 85 y.o. male returns for routine follow-up for DVT and smoldering myeloma. He is here today and has concerns for recent nose bleeds. He is requesting to switch blood thinners. Denies any nausea, vomiting, or diarrhea. Denies any new pains. Had not noticed any hematuria or hematochezia. Denies recent chest pain on exertion, shortness of breath on minimal exertion, pre-syncopal episodes, or palpitations. Denies any numbness or tingling in hands or feet. Denies any recent fevers, infections, or recent hospitalizations. Patient reports appetite at 100% and energy level at 50%.    REVIEW OF SYSTEMS:  Review of Systems  Neurological: Positive for dizziness.  All other systems reviewed and are negative.    PAST MEDICAL/SURGICAL HISTORY:  Past Medical History:  Diagnosis Date  . Anemia   . Glaucoma   . Hypertension    History reviewed. No pertinent surgical history.   SOCIAL HISTORY:  Social History   Socioeconomic History  . Marital status: Widowed    Spouse name: Not on file  . Number of children: Not on file  . Years of education: Not on file  . Highest education level: Not on file  Occupational History  . Not on file  Social Needs  . Financial resource strain: Not on file  . Food insecurity:    Worry: Not on file    Inability: Not on file  . Transportation needs:    Medical: Not on file    Non-medical: Not on file  Tobacco Use  . Smoking status: Never Smoker  . Smokeless tobacco: Never Used  Substance and Sexual Activity  . Alcohol use: No  . Drug use: No  . Sexual activity: Not Currently  Lifestyle  . Physical activity:    Days per  week: Not on file    Minutes per session: Not on file  . Stress: Not on file  Relationships  . Social connections:    Talks on phone: Not on file    Gets together: Not on file    Attends religious service: Not on file    Active member of club or organization: Not on file    Attends meetings of clubs or organizations: Not on file    Relationship status: Not on file  . Intimate partner violence:    Fear of current or ex partner: Not on file    Emotionally abused: Not on file    Physically abused: Not on file    Forced sexual activity: Not on file  Other Topics Concern  . Not on file  Social History Narrative  . Not on file    FAMILY HISTORY:  Family History  Problem Relation Age of Onset  . Hypertension Mother   . Heart attack Mother   . Heart attack Father   . Stroke Sister   . Alcoholism Brother   . Cirrhosis Brother     CURRENT MEDICATIONS:  Outpatient Encounter Medications as of 11/24/2018  Medication Sig Note  . albuterol (PROVENTIL HFA;VENTOLIN HFA) 108 (90 Base) MCG/ACT inhaler Inhale into the lungs. 06/29/2016: Received from: [place]: Inhale 2 puffs into the lungs as needed for Wheezing.  Marland Kitchen aspirin EC 81 MG tablet Take  81 mg by mouth daily.   . Cholecalciferol (VITAMIN D3) 2000 units TABS Take 2,000 Units by mouth daily.    . Cod Liver Oil OIL Take by mouth daily.    . cyanocobalamin 500 MCG tablet Take 500 mcg by mouth daily.   . dorzolamide (TRUSOPT) 2 % ophthalmic solution INSTILL ONE DROP IN [facility] EYE TWICE DAILY   . doxazosin (CARDURA) 8 MG tablet Take 8 mg by mouth daily.    . Ferrous Gluconate (IRON 27 PO) Take 1 tablet every morning by mouth.   . Glucosamine Sulfate 1000 MG CAPS Take by mouth. Takes twice a day   . hydrochlorothiazide (HYDRODIURIL) 50 MG tablet Take 50 mg by mouth daily.    Marland Kitchen latanoprost (XALATAN) 0.005 % ophthalmic solution Place 1 drop into both eyes at bedtime.   Marland Kitchen levocetirizine (XYZAL) 5 MG tablet Take 5 mg by mouth  daily.   Marland Kitchen loratadine (CLARITIN) 10 MG tablet Take 10 mg by mouth daily.    . rivaroxaban (XARELTO) 20 MG TABS tablet Take 1 tablet (20 mg total) by mouth daily with supper.   . Sildenafil Citrate (VIAGRA PO) Take by mouth.   Marland Kitchen apixaban (ELIQUIS) 5 MG TABS tablet Take 1 tablet (5 mg total) by mouth 2 (two) times daily.    No facility-administered encounter medications on file as of 11/24/2018.     ALLERGIES:  No Known Allergies   PHYSICAL EXAM:  ECOG Performance status: 1  Vitals:   11/24/18 1432  BP: 128/84  Pulse: (!) 45  Resp: 16  Temp: 98.4 F (36.9 C)  SpO2: 100%   Filed Weights   11/24/18 1432  Weight: 183 lb (83 kg)    Physical Exam Constitutional:      Appearance: Normal appearance. He is normal weight.  Cardiovascular:     Rate and Rhythm: Normal rate. Rhythm irregular.     Heart sounds: Normal heart sounds.  Pulmonary:     Effort: Pulmonary effort is normal.     Breath sounds: Normal breath sounds.  Musculoskeletal: Normal range of motion.  Skin:    General: Skin is warm and dry.  Neurological:     Mental Status: He is alert and oriented to person, place, and time. Mental status is at baseline.  Psychiatric:        Mood and Affect: Mood normal.        Behavior: Behavior normal.        Thought Content: Thought content normal.        Judgment: Judgment normal.   Extremities: Trace edema bilaterally.  This is stable.   LABORATORY DATA:  I have reviewed the labs as listed.  CBC    Component Value Date/Time   WBC 3.3 (L) 11/16/2018 1243   RBC 3.83 (L) 11/16/2018 1243   HGB 12.0 (L) 11/16/2018 1243   HCT 39.2 11/16/2018 1243   PLT 159 11/16/2018 1243   MCV 102.3 (H) 11/16/2018 1243   MCH 31.3 11/16/2018 1243   MCHC 30.6 11/16/2018 1243   RDW 14.1 11/16/2018 1243   LYMPHSABS 1.2 11/16/2018 1243   MONOABS 0.4 11/16/2018 1243   EOSABS 0.1 11/16/2018 1243   BASOSABS 0.0 11/16/2018 1243   CMP Latest Ref Rng & Units 11/16/2018 10/05/2018 07/13/2018    Glucose 70 - 99 mg/dL 108(H) 81 69(L)  BUN 8 - 23 mg/dL 13 12 17   Creatinine 0.61 - 1.24 mg/dL 1.25(H) 1.34(H) 1.29(H)  Sodium 135 - 145 mmol/L 132(L) 133(L) 134(L)  Potassium 3.5 -  5.1 mmol/L 3.7 3.6 3.8  Chloride 98 - 111 mmol/L 103 104 107  CO2 22 - 32 mmol/L 26 26 25   Calcium 8.9 - 10.3 mg/dL 8.8(L) 8.9 8.7(L)  Total Protein 6.5 - 8.1 g/dL 10.0(H) 10.1(H) 10.2(H)  Total Bilirubin 0.3 - 1.2 mg/dL 0.3 0.6 0.6  Alkaline Phos 38 - 126 U/L 40 37(L) 43  AST 15 - 41 U/L 22 22 26   ALT 0 - 44 U/L 10 10 13        DIAGNOSTIC IMAGING:  I have independently reviewed the scans and discussed with the patient.   I have reviewed Francene Finders, NP's note and agree with the documentation.  I personally performed a face-to-face visit, made revisions and my assessment and plan is as follows.    ASSESSMENT & PLAN:   Acute deep vein thrombosis (DVT) of femoral vein of left lower extremity (HCC) 1.  Recurrent left leg DVT: - A Doppler on 08/09/2018 showed positive acute thrombus in the left popliteal and femoral vein. -Patient has been on Coumadin for the past 13 years when he had pulmonary embolism in 2006.  The new clot happened while he is on Coumadin.  I do not know if the INR was therapeutic at that time. - He was started on Lovenox bridge, followed by Xarelto starter pack on 07/20/2018.  I have recommended indefinite anticoagulation. - He is continuing Xarelto 20 mg daily without any bleeding issues.  2.  Smoldering  myeloma: -Bone marrow biopsy on 01/07/2016 at [facility] shows trilineage hematopoiesis, plasma cells 13%.  FISH panel showed a additional IGH/14 q. signal.  Gain of chromosome 9 and CCN D1/11 q. chromosome analysis shows 60, XY. -Last PET CT scan in January 2018 did not reveal any suspicious lesions for myeloma.  He denies any new onset bone pains.  - Skeletal survey on 04/22/2018 was negative for lytic lesions. - Myeloma panel from 11/16/2018 shows light chain ratio of 12.32,  down from 16.28.  Free kappa light chains are 157, down from 187.  However M spike went up 4.3 g/dL from 4.0 in December 2019.  However his calcium and creatinine were at his baseline.  Hemoglobin was 12.  3.  Mediastinal adenopathy: - PET/CT in 2018 showed mediastinal adenopathy. -CT scan of the chest on 04/13/2018 to follow-up on lymphadenopathy showed complete resolution.       Orders placed this encounter:  Orders Placed This Encounter  Procedures  . DG Bone Survey Met  . Lactate dehydrogenase  . Protein electrophoresis, serum  . Kappa/lambda light chains  . CBC with Differential/Platelet  . Comprehensive metabolic panel      Derek Jack, MD [place] [phone]

## 2018-11-24 NOTE — Patient Instructions (Signed)
[facility] at [facility] Discharge Instructions     Thank you for choosing [facility] at [facility] to provide your oncology and hematology care.  To afford each patient quality time with our provider, please arrive at least 15 minutes before your scheduled appointment time.   If you have a lab appointment with the Cancer Center please come in thru the  Main Entrance and check in at the main information desk  You need to re-schedule your appointment should you arrive 10 or more minutes late.  We strive to give you quality time with our providers, and arriving late affects you and other patients whose appointments are after yours.  Also, if you no show three or more times for appointments you may be dismissed from the clinic at the providers discretion.     Again, thank you for choosing [facility].  Our hope is that these requests will decrease the amount of time that you wait before being seen by our physicians.       _____________________________________________________________  Should you have questions after your visit to  Cancer Center, please contact our office at [phone] between the hours of 8:00 a.m. and 4:30 p.m.  Voicemails left after 4:00 p.m. will not be returned until the following business day.  For prescription refill requests, have your pharmacy contact our office and allow 72 hours.    Cancer Center Support Programs:   > Cancer Support Group  2nd Tuesday of the month 1pm-2pm, Journey Room    

## 2018-12-07 NOTE — Telephone Encounter (Signed)
f °

## 2019-01-30 ENCOUNTER — Other Ambulatory Visit (HOSPITAL_COMMUNITY): Payer: Medicare FFS

## 2019-02-05 ENCOUNTER — Other Ambulatory Visit (HOSPITAL_COMMUNITY): Payer: Medicare FFS

## 2019-02-06 ENCOUNTER — Other Ambulatory Visit (HOSPITAL_COMMUNITY): Payer: Medicare FFS

## 2019-02-06 ENCOUNTER — Ambulatory Visit (HOSPITAL_COMMUNITY): Payer: Medicare FFS | Admitting: Hematology

## 2019-02-12 ENCOUNTER — Ambulatory Visit (HOSPITAL_COMMUNITY): Payer: Medicare FFS | Admitting: Hematology

## 2019-04-09 ENCOUNTER — Other Ambulatory Visit (HOSPITAL_COMMUNITY): Payer: Self-pay | Admitting: Nurse Practitioner

## 2019-04-09 DIAGNOSIS — I82412 Acute embolism and thrombosis of left femoral vein: Secondary | ICD-10-CM

## 2019-04-11 ENCOUNTER — Inpatient Hospital Stay (HOSPITAL_COMMUNITY): Payer: Medicare PPO | Attending: Hematology

## 2019-04-11 ENCOUNTER — Other Ambulatory Visit: Payer: Self-pay

## 2019-04-11 DIAGNOSIS — I82412 Acute embolism and thrombosis of left femoral vein: Secondary | ICD-10-CM | POA: Insufficient documentation

## 2019-04-11 DIAGNOSIS — I82432 Acute embolism and thrombosis of left popliteal vein: Secondary | ICD-10-CM | POA: Diagnosis not present

## 2019-04-11 DIAGNOSIS — Z86711 Personal history of pulmonary embolism: Secondary | ICD-10-CM | POA: Insufficient documentation

## 2019-04-11 DIAGNOSIS — C9000 Multiple myeloma not having achieved remission: Secondary | ICD-10-CM | POA: Diagnosis not present

## 2019-04-11 DIAGNOSIS — Z7901 Long term (current) use of anticoagulants: Secondary | ICD-10-CM | POA: Diagnosis not present

## 2019-04-11 DIAGNOSIS — D472 Monoclonal gammopathy: Secondary | ICD-10-CM

## 2019-04-11 LAB — COMPREHENSIVE METABOLIC PANEL
ALT: 10 U/L (ref 0–44)
AST: 23 U/L (ref 15–41)
Albumin: 3.4 g/dL — ABNORMAL LOW (ref 3.5–5.0)
Alkaline Phosphatase: 38 U/L (ref 38–126)
Anion gap: 5 (ref 5–15)
BUN: 15 mg/dL (ref 8–23)
CO2: 26 mmol/L (ref 22–32)
Calcium: 9.2 mg/dL (ref 8.9–10.3)
Chloride: 101 mmol/L (ref 98–111)
Creatinine, Ser: 1.29 mg/dL — ABNORMAL HIGH (ref 0.61–1.24)
GFR calc Af Amer: 58 mL/min — ABNORMAL LOW (ref >60)
GFR calc non Af Amer: 50 mL/min — ABNORMAL LOW (ref >60)
Glucose, Bld: 82 mg/dL (ref 70–99)
Potassium: 3.8 mmol/L (ref 3.5–5.1)
Sodium: 132 mmol/L — ABNORMAL LOW (ref 135–145)
Total Bilirubin: 0.7 mg/dL (ref 0.3–1.2)
Total Protein: 10.2 g/dL — ABNORMAL HIGH (ref 6.5–8.1)

## 2019-04-11 LAB — CBC WITH DIFFERENTIAL/PLATELET
Abs Immature Granulocytes: 0.01 10*3/uL (ref 0.00–0.07)
Basophils Absolute: 0 10*3/uL (ref 0.0–0.1)
Basophils Relative: 0 %
Eosinophils Absolute: 0 10*3/uL (ref 0.0–0.5)
Eosinophils Relative: 1 %
HCT: 38.7 % — ABNORMAL LOW (ref 39.0–52.0)
Hemoglobin: 12.3 g/dL — ABNORMAL LOW (ref 13.0–17.0)
Immature Granulocytes: 0 %
Lymphocytes Relative: 40 %
Lymphs Abs: 1.3 10*3/uL (ref 0.7–4.0)
MCH: 32.5 pg (ref 26.0–34.0)
MCHC: 31.8 g/dL (ref 30.0–36.0)
MCV: 102.1 fL — ABNORMAL HIGH (ref 80.0–100.0)
Monocytes Absolute: 0.4 10*3/uL (ref 0.1–1.0)
Monocytes Relative: 13 %
Neutro Abs: 1.5 10*3/uL — ABNORMAL LOW (ref 1.7–7.7)
Neutrophils Relative %: 46 %
Platelets: 163 10*3/uL (ref 150–400)
RBC: 3.79 MIL/uL — ABNORMAL LOW (ref 4.22–5.81)
RDW: 14.5 % (ref 11.5–15.5)
WBC: 3.2 10*3/uL — ABNORMAL LOW (ref 4.0–10.5)
nRBC: 0 % (ref 0.0–0.2)

## 2019-04-11 LAB — LACTATE DEHYDROGENASE: LDH: 151 U/L (ref 98–192)

## 2019-04-12 LAB — PROTEIN ELECTROPHORESIS, SERUM
A/G Ratio: 0.6 — ABNORMAL LOW (ref 0.7–1.7)
Albumin ELP: 3.8 g/dL (ref 2.9–4.4)
Alpha-1-Globulin: 0.3 g/dL (ref 0.0–0.4)
Alpha-2-Globulin: 0.7 g/dL (ref 0.4–1.0)
Beta Globulin: 0.9 g/dL (ref 0.7–1.3)
Gamma Globulin: 4.6 g/dL — ABNORMAL HIGH (ref 0.4–1.8)
Globulin, Total: 6.4 g/dL — ABNORMAL HIGH (ref 2.2–3.9)
M-Spike, %: 4.1 g/dL — ABNORMAL HIGH
Total Protein ELP: 10.2 g/dL — ABNORMAL HIGH (ref 6.0–8.5)

## 2019-04-12 LAB — KAPPA/LAMBDA LIGHT CHAINS
Kappa free light chain: 182.9 mg/L — ABNORMAL HIGH (ref 3.3–19.4)
Kappa, lambda light chain ratio: 13.65 — ABNORMAL HIGH (ref 0.26–1.65)
Lambda free light chains: 13.4 mg/L (ref 5.7–26.3)

## 2019-04-17 ENCOUNTER — Other Ambulatory Visit: Payer: Self-pay

## 2019-04-18 ENCOUNTER — Inpatient Hospital Stay (HOSPITAL_BASED_OUTPATIENT_CLINIC_OR_DEPARTMENT_OTHER): Payer: Medicare PPO | Admitting: Hematology

## 2019-04-18 ENCOUNTER — Encounter (HOSPITAL_COMMUNITY): Payer: Self-pay | Admitting: Hematology

## 2019-04-18 DIAGNOSIS — C9000 Multiple myeloma not having achieved remission: Secondary | ICD-10-CM

## 2019-04-18 DIAGNOSIS — I82432 Acute embolism and thrombosis of left popliteal vein: Secondary | ICD-10-CM

## 2019-04-18 DIAGNOSIS — Z86711 Personal history of pulmonary embolism: Secondary | ICD-10-CM

## 2019-04-18 DIAGNOSIS — Z7901 Long term (current) use of anticoagulants: Secondary | ICD-10-CM

## 2019-04-18 DIAGNOSIS — I82412 Acute embolism and thrombosis of left femoral vein: Secondary | ICD-10-CM

## 2019-04-18 NOTE — Assessment & Plan Note (Addendum)
1.  Smoldering myeloma: -Bone marrow biopsy on 01/07/2016 at [facility] shows trilineage hematopoiesis, plasma cells 13%.  FISH panel showed an additional IGH/14 Q signal.  Gain of chromosome 9 and CCN D1/11 q.  Chromosome analysis shows 51, XY. - PET scan in January 2018 did not reveal any suspicious lesions for myeloma.  Denies any bone pains. - Skeletal survey on 04/22/2018- for lytic lesions. - M spike on 04/11/2019 is 4.1 g/dL.  Kappa light chains are 182.9 and ratio is 13.65.  Calcium is normal.  Creatinine is stable.  Hemoglobin is also stable. - Does not require any active intervention at this time. -We will see him back in 4 months with labs and x-rays.  2.  Recurrent left leg DVT: -Doppler on 08/09/2018 showed positive acute thrombus in the left popliteal and femoral vein. -Patient has been on Coumadin for the past 13 years since he had pulmonary embolus in 2006.  The new clot happened while he was on Coumadin.  We do not know if the INR was therapeutic at that time. - He was started on Xarelto on 07/20/2018.  This was later switched to Eliquis.  He is taking Eliquis 5 mg twice daily. -He reported weakness since he started Eliquis. -I have offered him to cut back on Eliquis to 2.5 mg twice daily as he completed 6 months of full dose anticoagulation. -He was reluctant to decrease the dose at this time.  3.  Mediastinal adenopathy: - PET CT scan in 2018 showed mediastinal adenopathy. - CT scan of the chest on 04/13/2018 showed complete resolution.

## 2019-04-18 NOTE — Patient Instructions (Signed)
[facility] at [facility]  Discharge Instructions:  You saw Dr. Katragadda today. _______________________________________________________________  Thank you for choosing [facility] at [facility] to provide your oncology and hematology care.  To afford each patient quality time with our providers, please arrive at least 15 minutes before your scheduled appointment.  You need to re-schedule your appointment if you arrive 10 or more minutes late.  We strive to give you quality time with our providers, and arriving late affects you and other patients whose appointments are after yours.  Also, if you no show three or more times for appointments you may be dismissed from the clinic.  Again, thank you for choosing [facility] at [facility]. Our hope is that these requests will allow you access to exceptional care and in a timely manner. _______________________________________________________________  If you have questions after your visit, please contact our office at [phone] between the hours of 8:30 a.m. and 5:00 p.m. Voicemails left after 4:30 p.m. will not be returned until the following business day. _______________________________________________________________  For prescription refill requests, have your pharmacy contact our office. _______________________________________________________________  Recommendations made by the consultant and any test results will be sent to your referring physician. _______________________________________________________________ 

## 2019-04-18 NOTE — Progress Notes (Signed)
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  CURRENT THERAPY: Clinical Surveillance    INTERVAL HISTORY:  Dale Suarez 85 y.o. male presents for follow-up of smoldering myeloma and recurrent left leg DVT.  Denies any fevers, night sweats or weight loss in the last 6 months.  Denies any ER visits or hospitalizations.  Denies any nosebleeds, bleeding per rectum or hematuria.  He has reported feeling very tired since he started taking Eliquis 5 mg twice daily.  He does have minimal shortness of breath on activity.  Intermittent diarrhea has been stable.  Numbness in the left leg is also been stable.  Denies any nausea vomiting or constipation.   REVIEW OF SYSTEMS:  Review of Systems  Respiratory: Positive for shortness of breath.   Gastrointestinal: Positive for diarrhea.  Neurological: Positive for numbness.  All other systems reviewed and are negative.    PAST MEDICAL/SURGICAL HISTORY:  Past Medical History:  Diagnosis Date  . Anemia   . Glaucoma   . Hypertension    History reviewed. No pertinent surgical history.   SOCIAL HISTORY:  Social History   Socioeconomic History  . Marital status: Widowed    Spouse name: Not on file  . Number of children: Not on file  . Years of education: Not on file  . Highest education level: Not on file  Occupational History  . Not on file  Social Needs  . Financial resource strain: Not on file  . Food insecurity    Worry: Not on file    Inability: Not on file  . Transportation needs    Medical: Not on file    Non-medical: Not on file  Tobacco Use  . Smoking status: Never Smoker  . Smokeless tobacco: Never Used  Substance and Sexual Activity  . Alcohol use: No  . Drug use: No  . Sexual activity: Not Currently   Lifestyle  . Physical activity    Days per week: Not on file    Minutes per session: Not on file  . Stress: Not on file  Relationships  . Social Herbalist on phone: Not on file    Gets together: Not on file    Attends religious service: Not on file    Active member of club or organization: Not on file    Attends meetings of clubs or organizations: Not on file    Relationship status: Not on file  . Intimate partner violence    Fear of current or ex partner: Not on file    Emotionally abused: Not on file    Physically abused: Not on file    Forced sexual activity: Not on file  Other Topics Concern  . Not on file  Social History Narrative  . Not on file    FAMILY HISTORY:  Family History  Problem Relation Age of Onset  . Hypertension Mother   . Heart attack Mother   . Heart attack Father   . Stroke Sister   . Alcoholism Brother   . Cirrhosis Brother     CURRENT MEDICATIONS:  Outpatient Encounter Medications as of 04/18/2019  Medication Sig Note  . albuterol (PROVENTIL HFA;VENTOLIN HFA) 108 (90 Base) MCG/ACT inhaler Inhale into the lungs. 06/29/2016: Received from: [place]: Inhale  2 puffs into the lungs as needed for Wheezing.  . Cholecalciferol (VITAMIN D3) 2000 units TABS Take 2,000 Units by mouth daily.    . cyanocobalamin 500 MCG tablet Take 500 mcg by mouth daily.   . dorzolamide (TRUSOPT) 2 % ophthalmic solution INSTILL ONE DROP IN [facility] EYE TWICE DAILY   . doxazosin (CARDURA) 8 MG tablet Take 8 mg by mouth daily.    Marland Kitchen ELIQUIS 5 MG TABS tablet TAKE 1 TABLET BY MOUTH TWICE DAILY   . Ferrous Gluconate (IRON 27 PO) Take 1 tablet every morning by mouth.   . Glucosamine Sulfate 1000 MG CAPS Take by mouth. Takes twice a day   . hydrochlorothiazide (HYDRODIURIL) 50 MG tablet Take 50 mg by mouth daily.    Marland Kitchen ketoconazole (NIZORAL) 2 % cream 2 (two) times daily. as directed   . latanoprost (XALATAN) 0.005 % ophthalmic solution Place 1 drop into both  eyes at bedtime.   Marland Kitchen levocetirizine (XYZAL) 5 MG tablet Take 5 mg by mouth daily.   Marland Kitchen loratadine (CLARITIN) 10 MG tablet Take 10 mg by mouth daily.    . [DISCONTINUED] aspirin EC 81 MG tablet Take 81 mg by mouth daily.   . [DISCONTINUED] Cod Liver Oil OIL Take by mouth daily.    . [DISCONTINUED] rivaroxaban (XARELTO) 20 MG TABS tablet Take 1 tablet (20 mg total) by mouth daily with supper. (Patient not taking: Reported on 04/18/2019)   . [DISCONTINUED] Sildenafil Citrate (VIAGRA PO) Take by mouth.    No facility-administered encounter medications on file as of 04/18/2019.     ALLERGIES:  No Known Allergies   PHYSICAL EXAM:  ECOG Performance status: 1  Vitals:   04/18/19 1026  BP: (!) 120/58  Pulse: (!) 58  Resp: 16  Temp: 98.4 F (36.9 C)  SpO2: 99%   Filed Weights   04/18/19 1026  Weight: 174 lb 6.4 oz (79.1 kg)    Physical Exam Vitals signs reviewed.  Constitutional:      Appearance: Normal appearance.  Eyes:     Extraocular Movements: Extraocular movements intact.     Conjunctiva/sclera: Conjunctivae normal.  Cardiovascular:     Rate and Rhythm: Normal rate and regular rhythm.     Heart sounds: Normal heart sounds.  Pulmonary:     Effort: Pulmonary effort is normal.     Breath sounds: Normal breath sounds.  Abdominal:     General: There is no distension.     Palpations: Abdomen is soft. There is no mass.  Musculoskeletal:        General: Swelling present.  Skin:    General: Skin is warm.  Neurological:     General: No focal deficit present.     Mental Status: He is alert and oriented to person, place, and time.  Psychiatric:        Mood and Affect: Mood normal.        Behavior: Behavior normal.      LABORATORY DATA:  I have reviewed the labs as listed.  CBC    Component Value Date/Time   WBC 3.2 (L) 04/11/2019 1023   RBC 3.79 (L) 04/11/2019 1023   HGB 12.3 (L) 04/11/2019 1023   HCT 38.7 (L) 04/11/2019 1023   PLT 163 04/11/2019 1023   MCV 102.1  (H) 04/11/2019 1023   MCH 32.5 04/11/2019 1023   MCHC 31.8 04/11/2019 1023   RDW 14.5 04/11/2019 1023   LYMPHSABS 1.3 04/11/2019 1023   MONOABS 0.4 04/11/2019 1023   EOSABS  0.0 04/11/2019 1023   BASOSABS 0.0 04/11/2019 1023   CMP Latest Ref Rng & Units 04/11/2019 11/16/2018 10/05/2018  Glucose 70 - 99 mg/dL 82 108(H) 81  BUN 8 - 23 mg/dL 15 13 12   Creatinine 0.61 - 1.24 mg/dL 1.29(H) 1.25(H) 1.34(H)  Sodium 135 - 145 mmol/L 132(L) 132(L) 133(L)  Potassium 3.5 - 5.1 mmol/L 3.8 3.7 3.6  Chloride 98 - 111 mmol/L 101 103 104  CO2 22 - 32 mmol/L 26 26 26   Calcium 8.9 - 10.3 mg/dL 9.2 8.8(L) 8.9  Total Protein 6.5 - 8.1 g/dL 10.2(H) 10.0(H) 10.1(H)  Total Bilirubin 0.3 - 1.2 mg/dL 0.7 0.3 0.6  Alkaline Phos 38 - 126 U/L 38 40 37(L)  AST 15 - 41 U/L 23 22 22   ALT 0 - 44 U/L 10 10 10        ASSESSMENT & PLAN:   Multiple myeloma (HCC) 1.  Smoldering myeloma: -Bone marrow biopsy on 01/07/2016 at [facility] shows trilineage hematopoiesis, plasma cells 13%.  FISH panel showed an additional IGH/14 Q signal.  Gain of chromosome 9 and CCN D1/11 q.  Chromosome analysis shows 18, XY. - PET scan in January 2018 did not reveal any suspicious lesions for myeloma.  Denies any bone pains. - Skeletal survey on 04/22/2018- for lytic lesions. - M spike on 04/11/2019 is 4.1 g/dL.  Kappa light chains are 182.9 and ratio is 13.65.  Calcium is normal.  Creatinine is stable.  Hemoglobin is also stable. - Does not require any active intervention at this time. -We will see him back in 3 months with labs and x-rays.  2.  Recurrent left leg DVT: -Doppler on 08/09/2018 showed positive acute thrombus in the left popliteal and femoral vein. -Patient has been on Coumadin for the past 13 years since he had pulmonary embolus in 2006.  The new clot happened while he was on Coumadin.  We do not know if the INR was therapeutic at that time. - He was started on Xarelto on 07/20/2018.  We have recommended indefinite  anticoagulation. - He is tolerating it very well.  3.  Mediastinal adenopathy: - PET CT scan in 2018 showed mediastinal adenopathy. - CT scan of the chest on 04/13/2018 showed complete resolution.   Total time spent is 25 minutes more than 50% of the time spent face-to-face discussing test results, surveillance plan and coordination of care.  Orders placed this encounter:  Orders Placed This Encounter  Procedures  . CBC with Differential  . Comprehensive metabolic panel  . Kappa/lambda light chains  . Multiple Myeloma Panel (SPEP&IFE w/QIG)      Derek Jack, MD [place] [phone]

## 2019-08-09 ENCOUNTER — Other Ambulatory Visit: Payer: Self-pay

## 2019-08-09 ENCOUNTER — Inpatient Hospital Stay (HOSPITAL_COMMUNITY): Payer: Medicare PPO | Attending: Hematology

## 2019-08-09 DIAGNOSIS — C9000 Multiple myeloma not having achieved remission: Secondary | ICD-10-CM | POA: Diagnosis not present

## 2019-08-09 LAB — COMPREHENSIVE METABOLIC PANEL
ALT: 11 U/L (ref 0–44)
AST: 21 U/L (ref 15–41)
Albumin: 3.2 g/dL — ABNORMAL LOW (ref 3.5–5.0)
Alkaline Phosphatase: 46 U/L (ref 38–126)
Anion gap: 4 — ABNORMAL LOW (ref 5–15)
BUN: 15 mg/dL (ref 8–23)
CO2: 25 mmol/L (ref 22–32)
Calcium: 8.7 mg/dL — ABNORMAL LOW (ref 8.9–10.3)
Chloride: 104 mmol/L (ref 98–111)
Creatinine, Ser: 1.22 mg/dL (ref 0.61–1.24)
GFR calc Af Amer: >60 mL/min (ref >60)
GFR calc non Af Amer: 53 mL/min — ABNORMAL LOW (ref >60)
Glucose, Bld: 75 mg/dL (ref 70–99)
Potassium: 3.8 mmol/L (ref 3.5–5.1)
Sodium: 133 mmol/L — ABNORMAL LOW (ref 135–145)
Total Bilirubin: 0.4 mg/dL (ref 0.3–1.2)
Total Protein: 10 g/dL — ABNORMAL HIGH (ref 6.5–8.1)

## 2019-08-09 LAB — CBC WITH DIFFERENTIAL/PLATELET
Abs Immature Granulocytes: 0.01 10*3/uL (ref 0.00–0.07)
Basophils Absolute: 0 10*3/uL (ref 0.0–0.1)
Basophils Relative: 0 %
Eosinophils Absolute: 0.1 10*3/uL (ref 0.0–0.5)
Eosinophils Relative: 2 %
HCT: 35.8 % — ABNORMAL LOW (ref 39.0–52.0)
Hemoglobin: 11.2 g/dL — ABNORMAL LOW (ref 13.0–17.0)
Immature Granulocytes: 0 %
Lymphocytes Relative: 39 %
Lymphs Abs: 1.2 10*3/uL (ref 0.7–4.0)
MCH: 32.2 pg (ref 26.0–34.0)
MCHC: 31.3 g/dL (ref 30.0–36.0)
MCV: 102.9 fL — ABNORMAL HIGH (ref 80.0–100.0)
Monocytes Absolute: 0.4 10*3/uL (ref 0.1–1.0)
Monocytes Relative: 14 %
Neutro Abs: 1.4 10*3/uL — ABNORMAL LOW (ref 1.7–7.7)
Neutrophils Relative %: 45 %
Platelets: 156 10*3/uL (ref 150–400)
RBC: 3.48 MIL/uL — ABNORMAL LOW (ref 4.22–5.81)
RDW: 15.1 % (ref 11.5–15.5)
WBC: 3.1 10*3/uL — ABNORMAL LOW (ref 4.0–10.5)
nRBC: 0 % (ref 0.0–0.2)

## 2019-08-10 LAB — KAPPA/LAMBDA LIGHT CHAINS
Kappa free light chain: 202.9 mg/L — ABNORMAL HIGH (ref 3.3–19.4)
Kappa, lambda light chain ratio: 12.6 — ABNORMAL HIGH (ref 0.26–1.65)
Lambda free light chains: 16.1 mg/L (ref 5.7–26.3)

## 2019-08-13 LAB — MULTIPLE MYELOMA PANEL, SERUM
Albumin SerPl Elph-Mcnc: 3.6 g/dL (ref 2.9–4.4)
Albumin/Glob SerPl: 0.7 (ref 0.7–1.7)
Alpha 1: 0.3 g/dL (ref 0.0–0.4)
Alpha2 Glob SerPl Elph-Mcnc: 0.7 g/dL (ref 0.4–1.0)
B-Globulin SerPl Elph-Mcnc: 0.8 g/dL (ref 0.7–1.3)
Gamma Glob SerPl Elph-Mcnc: 4.3 g/dL — ABNORMAL HIGH (ref 0.4–1.8)
Globulin, Total: 6 g/dL — ABNORMAL HIGH (ref 2.2–3.9)
IgA: <5 mg/dL — ABNORMAL LOW (ref 61–437)
IgG (Immunoglobin G), Serum: 5629 mg/dL — ABNORMAL HIGH (ref 603–1613)
IgM (Immunoglobulin M), Srm: 14 mg/dL — ABNORMAL LOW (ref 15–143)
M Protein SerPl Elph-Mcnc: 3.8 g/dL — ABNORMAL HIGH
Total Protein ELP: 9.6 g/dL — ABNORMAL HIGH (ref 6.0–8.5)

## 2019-08-15 ENCOUNTER — Other Ambulatory Visit: Payer: Self-pay

## 2019-08-16 ENCOUNTER — Inpatient Hospital Stay (HOSPITAL_COMMUNITY): Payer: Medicare PPO | Admitting: Hematology

## 2019-08-16 DIAGNOSIS — C9000 Multiple myeloma not having achieved remission: Secondary | ICD-10-CM

## 2019-08-17 NOTE — Progress Notes (Signed)
Dale Suarez, [name] [id number]   CLINIC:  Medical Oncology/Hematology  PCP:  Dale Burly, MD [place] [phone]   REASON FOR VISIT:  Follow-up for Smoldering MM   CURRENT THERAPY: Clinical surveillance    INTERVAL HISTORY:  Dale Suarez 85 y.o. male presents today for follow-up.  He reports overall doing well.  He denies any significant fatigue. He denies any new bony pains.  Denies any obvious signs of bleeding.  Denies any recent hospitalizations or infections.  Denies any changes in bowel habits.  No weight loss.  Appetite is stable.  He is here for repeat labs and office visit.   REVIEW OF SYSTEMS:  Review of Systems  Constitutional: Negative.   HENT:  Negative.   Eyes: Negative.   Respiratory: Negative.   Cardiovascular: Negative.   Gastrointestinal: Negative.   Endocrine: Negative.   Genitourinary: Negative.    Musculoskeletal: Positive for arthralgias.  Skin: Negative.   Neurological: Negative.   Hematological: Negative.   Psychiatric/Behavioral: Negative.      PAST MEDICAL/SURGICAL HISTORY:  Past Medical History:  Diagnosis Date  . Anemia   . Glaucoma   . Hypertension    No past surgical history on file.   SOCIAL HISTORY:  Social History   Socioeconomic History  . Marital status: Widowed    Spouse name: Not on file  . Number of children: Not on file  . Years of education: Not on file  . Highest education level: Not on file  Occupational History  . Not on file  Social Needs  . Financial resource strain: Not on file  . Food insecurity    Worry: Not on file    Inability: Not on file  . Transportation needs    Medical: Not on file    Non-medical: Not on file  Tobacco Use  . Smoking status: Never Smoker  . Smokeless tobacco: Never Used  Substance and Sexual Activity  . Alcohol use: No  . Drug use: No  . Sexual activity: Not Currently  Lifestyle  . Physical  activity    Days per week: Not on file    Minutes per session: Not on file  . Stress: Not on file  Relationships  . Social Herbalist on phone: Not on file    Gets together: Not on file    Attends religious service: Not on file    Active member of club or organization: Not on file    Attends meetings of clubs or organizations: Not on file    Relationship status: Not on file  . Intimate partner violence    Fear of current or ex partner: Not on file    Emotionally abused: Not on file    Physically abused: Not on file    Forced sexual activity: Not on file  Other Topics Concern  . Not on file  Social History Narrative  . Not on file    FAMILY HISTORY:  Family History  Problem Relation Age of Onset  . Hypertension Mother   . Heart attack Mother   . Heart attack Father   . Stroke Sister   . Alcoholism Brother   . Cirrhosis Brother     CURRENT MEDICATIONS:  Outpatient Encounter Medications as of 08/16/2019  Medication Sig Note  . Cholecalciferol (VITAMIN D3) 2000 units TABS Take 2,000 Units by mouth daily.    . cyanocobalamin 500 MCG tablet Take 500  mcg by mouth daily.   . dorzolamide (TRUSOPT) 2 % ophthalmic solution INSTILL ONE DROP IN [facility] EYE TWICE DAILY   . doxazosin (CARDURA) 8 MG tablet Take 8 mg by mouth daily.    . Ferrous Gluconate (IRON 27 PO) Take 1 tablet every morning by mouth.   . Glucosamine Sulfate 1000 MG CAPS Take by mouth. Takes twice a day   . hydrochlorothiazide (HYDRODIURIL) 50 MG tablet Take 50 mg by mouth daily.    Marland Kitchen ketoconazole (NIZORAL) 2 % cream 2 (two) times daily. as directed   . latanoprost (XALATAN) 0.005 % ophthalmic solution Place 1 drop into both eyes at bedtime.   Marland Kitchen levocetirizine (XYZAL) 5 MG tablet Take 5 mg by mouth daily.   Marland Kitchen loratadine (CLARITIN) 10 MG tablet Take 10 mg by mouth daily.    Marland Kitchen warfarin (COUMADIN) 5 MG tablet TAKE 1 TABLET BY MOUTH EVERY OTHER DAY ALTERNATING WITH ONE & 1/2 TABLETS EVERY OTHER DAY   .  albuterol (PROVENTIL HFA;VENTOLIN HFA) 108 (90 Base) MCG/ACT inhaler Inhale into the lungs. 06/29/2016: Received from: [place]: Inhale 2 puffs into the lungs as needed for Wheezing.  . [DISCONTINUED] ELIQUIS 5 MG TABS tablet TAKE 1 TABLET BY MOUTH TWICE DAILY    No facility-administered encounter medications on file as of 08/16/2019.     ALLERGIES:  No Known Allergies   PHYSICAL EXAM:  ECOG Performance status: 1  Vitals:   08/16/19 1233  BP: 120/74  Pulse: 62  Resp: 18  Temp: 97.7 F (36.5 C)  SpO2: 100%   Filed Weights   08/16/19 1233  Weight: 177 lb 14.4 oz (80.7 kg)    Physical Exam Constitutional:      Appearance: He is normal weight.  HENT:     Head: Normocephalic.     Right Ear: External ear normal.     Left Ear: External ear normal.     Nose: Nose normal.     Mouth/Throat:     Pharynx: Oropharynx is clear.  Eyes:     Conjunctiva/sclera: Conjunctivae normal.  Neck:     Musculoskeletal: Normal range of motion.  Cardiovascular:     Rate and Rhythm: Normal rate and regular rhythm.     Pulses: Normal pulses.     Heart sounds: Normal heart sounds.  Pulmonary:     Effort: Pulmonary effort is normal.     Breath sounds: Normal breath sounds.  Abdominal:     General: Bowel sounds are normal.  Musculoskeletal: Normal range of motion.  Skin:    General: Skin is warm.  Neurological:     General: No focal deficit present.     Mental Status: He is alert. Mental status is at baseline.  Psychiatric:        Mood and Affect: Mood normal.        Behavior: Behavior normal.        Thought Content: Thought content normal.        Judgment: Judgment normal.      LABORATORY DATA:  I have reviewed the labs as listed.  CBC    Component Value Date/Time   WBC 3.1 (L) 08/09/2019 1023   RBC 3.48 (L) 08/09/2019 1023   HGB 11.2 (L) 08/09/2019 1023   HCT 35.8 (L) 08/09/2019 1023   PLT 156 08/09/2019 1023   MCV 102.9 (H) 08/09/2019 1023   MCH 32.2  08/09/2019 1023   MCHC 31.3 08/09/2019 1023   RDW 15.1 08/09/2019 1023   LYMPHSABS  1.2 08/09/2019 1023   MONOABS 0.4 08/09/2019 1023   EOSABS 0.1 08/09/2019 1023   BASOSABS 0.0 08/09/2019 1023   CMP Latest Ref Rng & Units 08/09/2019 04/11/2019 11/16/2018  Glucose 70 - 99 mg/dL 75 82 108(H)  BUN 8 - 23 mg/dL 15 15 13   Creatinine 0.61 - 1.24 mg/dL 1.22 1.29(H) 1.25(H)  Sodium 135 - 145 mmol/L 133(L) 132(L) 132(L)  Potassium 3.5 - 5.1 mmol/L 3.8 3.8 3.7  Chloride 98 - 111 mmol/L 104 101 103  CO2 22 - 32 mmol/L 25 26 26   Calcium 8.9 - 10.3 mg/dL 8.7(L) 9.2 8.8(L)  Total Protein 6.5 - 8.1 g/dL 10.0(H) 10.2(H) 10.0(H)  Total Bilirubin 0.3 - 1.2 mg/dL 0.4 0.7 0.3  Alkaline Phos 38 - 126 U/L 46 38 40  AST 15 - 41 U/L 21 23 22   ALT 0 - 44 U/L 11 10 10         ASSESSMENT & PLAN:   Multiple myeloma (HCC) 1.  Smoldering myeloma: -Bone marrow biopsy on 01/07/2016 at [facility] shows trilineage hematopoiesis, plasma cells 13%.  FISH panel showed an additional IGH/14 Q signal.  Gain of chromosome 9 and CCN D1/11 q.  Chromosome analysis shows 6, XY. - PET scan in January 2018 did not reveal any suspicious lesions for myeloma.  Denies any bone pains. - Skeletal survey on 04/22/2018 Negative for lytic lesions. -Multiple myeloma labs from August 09, 2019 revealed M spike of 3.8 g/dL, IgG level of 5629, IFE positive for IgG kappa light chain specificity, light chains elevated at two 2.9, lambda 16.1, ratio 12.6, he has mild anemia with a hemoglobin of 11.2.  He has no renal insufficiency.  He will need a repeat skeletal survey. -Per supervising physicians recommendations we will continue to monitor the patient every 3 months.  2.  Recurrent left leg DVT: -Doppler on 08/09/2018 showed positive acute thrombus in the left popliteal and femoral vein. -Patient has been on Coumadin for the past 13 years since he had pulmonary embolus in 2006.  The new clot happened while he was on Coumadin.  We do not  know if the INR was therapeutic at that time. - He was started on Xarelto on 07/20/2018.  This was later switched to Eliquis.  He is taking Eliquis 5 mg twice daily. -He reported weakness since he started Eliquis. -I have offered him to cut back on Eliquis to 2.5 mg twice daily as he completed 6 months of full dose anticoagulation. -He was reluctant to decrease the dose at this time.  3.  Mediastinal adenopathy: - PET CT scan in 2018 showed mediastinal adenopathy. - CT scan of the chest on 04/13/2018 showed complete resolution.      [place] [phone]

## 2019-10-09 NOTE — Assessment & Plan Note (Signed)
1.  Smoldering myeloma: -Bone marrow biopsy on 01/07/2016 at [facility] shows trilineage hematopoiesis, plasma cells 13%.  FISH panel showed an additional IGH/14 Q signal.  Gain of chromosome 9 and CCN D1/11 q.  Chromosome analysis shows 79, XY. - PET scan in January 2018 did not reveal any suspicious lesions for myeloma.  Denies any bone pains. - Skeletal survey on 04/22/2018 Negative for lytic lesions. -Multiple myeloma labs from August 09, 2019 revealed M spike of 3.8 g/dL, IgG level of 5629, IFE positive for IgG kappa light chain specificity, light chains elevated at two 2.9, lambda 16.1, ratio 12.6, he has mild anemia with a hemoglobin of 11.2.  He has no renal insufficiency.  He will need a repeat skeletal survey. -Per supervising physicians recommendations we will continue to monitor the patient every 3 months.  2.  Recurrent left leg DVT: -Doppler on 08/09/2018 showed positive acute thrombus in the left popliteal and femoral vein. -Patient has been on Coumadin for the past 13 years since he had pulmonary embolus in 2006.  The new clot happened while he was on Coumadin.  We do not know if the INR was therapeutic at that time. - He was started on Xarelto on 07/20/2018.  This was later switched to Eliquis.  He is taking Eliquis 5 mg twice daily. -He reported weakness since he started Eliquis. -I have offered him to cut back on Eliquis to 2.5 mg twice daily as he completed 6 months of full dose anticoagulation. -He was reluctant to decrease the dose at this time.  3.  Mediastinal adenopathy: - PET CT scan in 2018 showed mediastinal adenopathy. - CT scan of the chest on 04/13/2018 showed complete resolution.

## 2019-10-30 ENCOUNTER — Other Ambulatory Visit (HOSPITAL_COMMUNITY): Payer: Medicare PPO

## 2019-11-06 ENCOUNTER — Ambulatory Visit (HOSPITAL_COMMUNITY): Payer: Medicare PPO | Admitting: Hematology

## 2019-11-07 ENCOUNTER — Other Ambulatory Visit (HOSPITAL_COMMUNITY): Payer: Self-pay | Admitting: *Deleted

## 2019-11-07 DIAGNOSIS — I82412 Acute embolism and thrombosis of left femoral vein: Secondary | ICD-10-CM

## 2019-11-07 DIAGNOSIS — C9000 Multiple myeloma not having achieved remission: Secondary | ICD-10-CM

## 2019-11-08 ENCOUNTER — Other Ambulatory Visit: Payer: Self-pay

## 2019-11-08 ENCOUNTER — Inpatient Hospital Stay (HOSPITAL_COMMUNITY): Payer: Medicare FFS | Attending: Hematology

## 2019-11-08 ENCOUNTER — Encounter (HOSPITAL_COMMUNITY): Payer: Self-pay

## 2019-11-08 ENCOUNTER — Ambulatory Visit (HOSPITAL_COMMUNITY)
Admission: RE | Admit: 2019-11-08 | Discharge: 2019-11-08 | Disposition: A | Discharge status: Home / Self Care | Payer: Medicare FFS | Source: Ambulatory Visit | Attending: Nurse Practitioner | Admitting: Nurse Practitioner

## 2019-11-08 DIAGNOSIS — Z79899 Other long term (current) drug therapy: Secondary | ICD-10-CM | POA: Insufficient documentation

## 2019-11-08 DIAGNOSIS — H409 Unspecified glaucoma: Secondary | ICD-10-CM | POA: Insufficient documentation

## 2019-11-08 DIAGNOSIS — I82412 Acute embolism and thrombosis of left femoral vein: Secondary | ICD-10-CM | POA: Insufficient documentation

## 2019-11-08 DIAGNOSIS — Z86718 Personal history of other venous thrombosis and embolism: Secondary | ICD-10-CM | POA: Insufficient documentation

## 2019-11-08 DIAGNOSIS — C9000 Multiple myeloma not having achieved remission: Secondary | ICD-10-CM | POA: Insufficient documentation

## 2019-11-08 DIAGNOSIS — R509 Fever, unspecified: Secondary | ICD-10-CM | POA: Insufficient documentation

## 2019-11-08 DIAGNOSIS — D472 Monoclonal gammopathy: Secondary | ICD-10-CM

## 2019-11-08 DIAGNOSIS — I10 Essential (primary) hypertension: Secondary | ICD-10-CM | POA: Diagnosis not present

## 2019-11-08 DIAGNOSIS — Z86711 Personal history of pulmonary embolism: Secondary | ICD-10-CM | POA: Insufficient documentation

## 2019-11-08 DIAGNOSIS — Z7901 Long term (current) use of anticoagulants: Secondary | ICD-10-CM | POA: Diagnosis not present

## 2019-11-08 LAB — CBC WITH DIFFERENTIAL/PLATELET
Abs Immature Granulocytes: 0.01 10*3/uL (ref 0.00–0.07)
Basophils Absolute: 0 10*3/uL (ref 0.0–0.1)
Basophils Relative: 1 %
Eosinophils Absolute: 0.1 10*3/uL (ref 0.0–0.5)
Eosinophils Relative: 2 %
HCT: 35.9 % — ABNORMAL LOW (ref 39.0–52.0)
Hemoglobin: 10.9 g/dL — ABNORMAL LOW (ref 13.0–17.0)
Immature Granulocytes: 0 %
Lymphocytes Relative: 37 %
Lymphs Abs: 1.2 10*3/uL (ref 0.7–4.0)
MCH: 31.2 pg (ref 26.0–34.0)
MCHC: 30.4 g/dL (ref 30.0–36.0)
MCV: 102.9 fL — ABNORMAL HIGH (ref 80.0–100.0)
Monocytes Absolute: 0.6 10*3/uL (ref 0.1–1.0)
Monocytes Relative: 18 %
Neutro Abs: 1.4 10*3/uL — ABNORMAL LOW (ref 1.7–7.7)
Neutrophils Relative %: 42 %
Platelets: 191 10*3/uL (ref 150–400)
RBC: 3.49 MIL/uL — ABNORMAL LOW (ref 4.22–5.81)
RDW: 15.4 % (ref 11.5–15.5)
WBC: 3.3 10*3/uL — ABNORMAL LOW (ref 4.0–10.5)
nRBC: 0 % (ref 0.0–0.2)

## 2019-11-08 LAB — COMPREHENSIVE METABOLIC PANEL
ALT: 13 U/L (ref 0–44)
AST: 25 U/L (ref 15–41)
Albumin: 3.1 g/dL — ABNORMAL LOW (ref 3.5–5.0)
Alkaline Phosphatase: 42 U/L (ref 38–126)
Anion gap: <3 — ABNORMAL LOW (ref 5–15)
BUN: 17 mg/dL (ref 8–23)
CO2: 26 mmol/L (ref 22–32)
Calcium: 8.7 mg/dL — ABNORMAL LOW (ref 8.9–10.3)
Chloride: 104 mmol/L (ref 98–111)
Creatinine, Ser: 1.34 mg/dL — ABNORMAL HIGH (ref 0.61–1.24)
GFR calc Af Amer: 55 mL/min — ABNORMAL LOW (ref >60)
GFR calc non Af Amer: 48 mL/min — ABNORMAL LOW (ref >60)
Glucose, Bld: 75 mg/dL (ref 70–99)
Potassium: 4 mmol/L (ref 3.5–5.1)
Sodium: 132 mmol/L — ABNORMAL LOW (ref 135–145)
Total Bilirubin: 0.6 mg/dL (ref 0.3–1.2)
Total Protein: 10.2 g/dL — ABNORMAL HIGH (ref 6.5–8.1)

## 2019-11-09 LAB — KAPPA/LAMBDA LIGHT CHAINS
Kappa free light chain: 236.9 mg/L — ABNORMAL HIGH (ref 3.3–19.4)
Kappa, lambda light chain ratio: 16.8 — ABNORMAL HIGH (ref 0.26–1.65)
Lambda free light chains: 14.1 mg/L (ref 5.7–26.3)

## 2019-11-13 LAB — MULTIPLE MYELOMA PANEL, SERUM
Albumin SerPl Elph-Mcnc: 3.7 g/dL (ref 2.9–4.4)
Albumin/Glob SerPl: 0.7 (ref 0.7–1.7)
Alpha 1: 0.2 g/dL (ref 0.0–0.4)
Alpha2 Glob SerPl Elph-Mcnc: 0.6 g/dL (ref 0.4–1.0)
B-Globulin SerPl Elph-Mcnc: 0.8 g/dL (ref 0.7–1.3)
Gamma Glob SerPl Elph-Mcnc: 4.5 g/dL — ABNORMAL HIGH (ref 0.4–1.8)
Globulin, Total: 6.1 g/dL — ABNORMAL HIGH (ref 2.2–3.9)
IgA: <5 mg/dL — ABNORMAL LOW (ref 61–437)
IgG (Immunoglobin G), Serum: 6183 mg/dL — ABNORMAL HIGH (ref 603–1613)
IgM (Immunoglobulin M), Srm: 10 mg/dL — ABNORMAL LOW (ref 15–143)
M Protein SerPl Elph-Mcnc: 4.3 g/dL — ABNORMAL HIGH
Total Protein ELP: 9.8 g/dL — ABNORMAL HIGH (ref 6.0–8.5)

## 2019-11-15 ENCOUNTER — Ambulatory Visit (HOSPITAL_COMMUNITY): Payer: Medicare PPO | Admitting: Nurse Practitioner

## 2019-11-20 ENCOUNTER — Inpatient Hospital Stay (HOSPITAL_COMMUNITY): Payer: Medicare FFS | Admitting: Nurse Practitioner

## 2019-11-20 ENCOUNTER — Other Ambulatory Visit (HOSPITAL_COMMUNITY): Payer: Self-pay

## 2019-11-20 ENCOUNTER — Encounter (INDEPENDENT_AMBULATORY_CARE_PROVIDER_SITE_OTHER): Payer: Self-pay

## 2019-11-20 ENCOUNTER — Other Ambulatory Visit (HOSPITAL_COMMUNITY)
Admission: RE | Admit: 2019-11-20 | Discharge: 2019-11-20 | Disposition: A | Discharge status: Home / Self Care | Payer: Medicare FFS | Source: Ambulatory Visit | Attending: Nurse Practitioner | Admitting: Nurse Practitioner

## 2019-11-20 ENCOUNTER — Other Ambulatory Visit: Payer: Self-pay

## 2019-11-20 DIAGNOSIS — C9000 Multiple myeloma not having achieved remission: Secondary | ICD-10-CM | POA: Insufficient documentation

## 2019-11-20 DIAGNOSIS — R590 Localized enlarged lymph nodes: Secondary | ICD-10-CM | POA: Insufficient documentation

## 2019-11-20 DIAGNOSIS — I82412 Acute embolism and thrombosis of left femoral vein: Secondary | ICD-10-CM

## 2019-11-20 DIAGNOSIS — D472 Monoclonal gammopathy: Secondary | ICD-10-CM

## 2019-11-20 LAB — PROTEIN, URINE, 24 HOUR
Collection Interval-UPROT: 24 hours
Protein, Urine: <6 mg/dL
Urine Total Volume-UPROT: 1300 mL

## 2019-11-20 NOTE — Progress Notes (Signed)
[place], [place] [address]   CLINIC:  Medical Oncology/Hematology  PCP:  Neale Burly, MD [place] [id number] [phone]   REASON FOR VISIT: Follow-up for smoldering myeloma  CURRENT THERAPY: Observation   INTERVAL HISTORY:  Dale Suarez 86 y.o. male returns for routine follow-up for smoldering myeloma.  Patient reports he is feeling more weak.  He reports he is not eating as well as he used to eat.  He reports he is not a very good cook and he is used to eating out a lot, but due to the Covid he is unable to do this.  He denies any B symptoms. Denies any nausea, vomiting, or diarrhea. Denies any new pains. Had not noticed any recent bleeding such as epistaxis, hematuria or hematochezia. Denies recent chest pain on exertion, shortness of breath on minimal exertion, pre-syncopal episodes, or palpitations. Denies any numbness or tingling in hands or feet. Denies any recent fevers, infections, or recent hospitalizations. Patient reports appetite at 50% and energy level at 25%.   REVIEW OF SYSTEMS:  Review of Systems  Constitutional: Positive for fatigue.  Neurological: Positive for dizziness and extremity weakness.  All other systems reviewed and are negative.    PAST MEDICAL/SURGICAL HISTORY:  Past Medical History:  Diagnosis Date  . Anemia   . Glaucoma   . Hypertension    No past surgical history on file.   SOCIAL HISTORY:  Social History   Socioeconomic History  . Marital status: Widowed    Spouse name: Not on file  . Number of children: Not on file  . Years of education: Not on file  . Highest education level: Not on file  Occupational History  . Not on file  Tobacco Use  . Smoking status: Never Smoker  . Smokeless tobacco: Never Used  Substance and Sexual Activity  . Alcohol use: No  . Drug use: No  . Sexual activity: Not Currently  Other Topics Concern  . Not on file  Social History  Narrative  . Not on file   Social Determinants of Health   Financial Resource Strain:   . Difficulty of Paying Living Expenses: Not on file  Food Insecurity:   . Worried About Charity fundraiser in the Last Year: Not on file  . Ran Out of Food in the Last Year: Not on file  Transportation Needs:   . Lack of Transportation (Medical): Not on file  . Lack of Transportation (Non-Medical): Not on file  Physical Activity:   . Days of Exercise per Week: Not on file  . Minutes of Exercise per Session: Not on file  Stress:   . Feeling of Stress : Not on file  Social Connections:   . Frequency of Communication with Friends and Family: Not on file  . Frequency of Social Gatherings with Friends and Family: Not on file  . Attends Religious Services: Not on file  . Active Member of Clubs or Organizations: Not on file  . Attends Archivist Meetings: Not on file  . Marital Status: Not on file  Intimate Partner Violence:   . Fear of Current or Ex-Partner: Not on file  . Emotionally Abused: Not on file  . Physically Abused: Not on file  . Sexually Abused: Not on file    FAMILY HISTORY:  Family History  Problem Relation Age of Onset  . Hypertension Mother   . Heart attack Mother   .  Heart attack Father   . Stroke Sister   . Alcoholism Brother   . Cirrhosis Brother     CURRENT MEDICATIONS:  Outpatient Encounter Medications as of 11/20/2019  Medication Sig Note  . Cholecalciferol (VITAMIN D3) 2000 units TABS Take 2,000 Units by mouth daily.    . cyanocobalamin 500 MCG tablet Take 500 mcg by mouth daily.   . dorzolamide (TRUSOPT) 2 % ophthalmic solution INSTILL ONE DROP IN [facility] EYE TWICE DAILY   . doxazosin (CARDURA) 8 MG tablet Take 8 mg by mouth daily.    . Ferrous Gluconate (IRON 27 PO) Take 1 tablet every morning by mouth.   . Glucosamine Sulfate 1000 MG CAPS Take by mouth. Takes twice a day   . hydrochlorothiazide (HYDRODIURIL) 50 MG tablet Take 50 mg by mouth daily.      Marland Kitchen ketoconazole (NIZORAL) 2 % cream 2 (two) times daily. as directed   . latanoprost (XALATAN) 0.005 % ophthalmic solution Place 1 drop into both eyes at bedtime.   Marland Kitchen levocetirizine (XYZAL) 5 MG tablet Take 5 mg by mouth daily.   Marland Kitchen loratadine (CLARITIN) 10 MG tablet Take 10 mg by mouth daily.    Marland Kitchen warfarin (COUMADIN) 5 MG tablet TAKE 1 TABLET BY MOUTH EVERY OTHER DAY ALTERNATING WITH ONE & 1/2 TABLETS EVERY OTHER DAY   . albuterol (PROVENTIL HFA;VENTOLIN HFA) 108 (90 Base) MCG/ACT inhaler Inhale into the lungs. 06/29/2016: Received from: [place]: Inhale 2 puffs into the lungs as needed for Wheezing.   No facility-administered encounter medications on file as of 11/20/2019.    ALLERGIES:  No Known Allergies   PHYSICAL EXAM:  ECOG Performance status: 1  Vitals:   11/20/19 1412  BP: 132/75  Pulse: 77  Resp: 18  Temp: (!) 97.5 F (36.4 C)  SpO2: 100%   Filed Weights   11/20/19 1412  Weight: 180 lb 3.2 oz (81.7 kg)    Physical Exam Constitutional:      Appearance: Normal appearance. He is normal weight.  Cardiovascular:     Rate and Rhythm: Normal rate and regular rhythm.     Heart sounds: Normal heart sounds.  Pulmonary:     Effort: Pulmonary effort is normal.     Breath sounds: Normal breath sounds.  Abdominal:     General: Bowel sounds are normal.     Palpations: Abdomen is soft.  Musculoskeletal:        General: Normal range of motion.  Skin:    General: Skin is warm.  Neurological:     Mental Status: He is alert and oriented to person, place, and time. Mental status is at baseline.  Psychiatric:        Mood and Affect: Mood normal.        Behavior: Behavior normal.        Thought Content: Thought content normal.        Judgment: Judgment normal.      LABORATORY DATA:  I have reviewed the labs as listed.  CBC    Component Value Date/Time   WBC 3.3 (L) 11/08/2019 1146   RBC 3.49 (L) 11/08/2019 1146   HGB 10.9 (L) 11/08/2019 1146   HCT  35.9 (L) 11/08/2019 1146   PLT 191 11/08/2019 1146   MCV 102.9 (H) 11/08/2019 1146   MCH 31.2 11/08/2019 1146   MCHC 30.4 11/08/2019 1146   RDW 15.4 11/08/2019 1146   LYMPHSABS 1.2 11/08/2019 1146   MONOABS 0.6 11/08/2019 1146   EOSABS 0.1  11/08/2019 1146   BASOSABS 0.0 11/08/2019 1146   CMP Latest Ref Rng & Units 11/08/2019 08/09/2019 04/11/2019  Glucose 70 - 99 mg/dL 75 75 82  BUN 8 - 23 mg/dL 17 15 15   Creatinine 0.61 - 1.24 mg/dL 1.34(H) 1.22 1.29(H)  Sodium 135 - 145 mmol/L 132(L) 133(L) 132(L)  Potassium 3.5 - 5.1 mmol/L 4.0 3.8 3.8  Chloride 98 - 111 mmol/L 104 104 101  CO2 22 - 32 mmol/L 26 25 26   Calcium 8.9 - 10.3 mg/dL 8.7(L) 8.7(L) 9.2  Total Protein 6.5 - 8.1 g/dL 10.2(H) 10.0(H) 10.2(H)  Total Bilirubin 0.3 - 1.2 mg/dL 0.6 0.4 0.7  Alkaline Phos 38 - 126 U/L 42 46 38  AST 15 - 41 U/L 25 21 23   ALT 0 - 44 U/L 13 11 10     DIAGNOSTIC IMAGING:  I have independently reviewed the bone scans and discussed with the patient.  I personally performed a face-to-face visit.  All questions were answered to patient's stated satisfaction. Encouraged patient to call with any new concerns or questions before his next visit to the cancer center and we can certain see him sooner, if needed.     ASSESSMENT & PLAN:   Multiple myeloma ([place]) 1.  Smoldering myeloma: -Bone marrow biopsy on 01/07/2016 at [facility] showing trilineage hematopoiesis, plasma cells 13% FISH panel showed an additional IGH/14 q. signal.  Gain of chromosome 9 and CCN D1/11 q. 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 -PET scan in January 2018 did not reveal any suspicious lesions from myeloma.  He denies any bone pain. -Skeletal survey on 04/22/2018 - for any lytic lesions. -Myeloma labs from 11/08/2019 revealed M spike of 4.3 g/dL, IgG level of 6183, IFE positive for IgG kappa light chain specificity, light chain elevated at 236.9, lambda 14.1, ratio 16.8, he has mild anemia with hemoglobin of 10.9.  Creatinine has gone  up to 1.34. -Repeat of his skeletal survey done on 11/08/2019 showed new small lytic lesions in the proximal right and left humerus, proximal left tibia, consistent with myeloma. -We will order a PET CT scan and he will follow up afterwards  2.  Recurrent left leg DVT: -Doppler on 08/09/2018 showed positive acute thrombus in the left popliteal and femoral vein. -Patient has been on Coumadin for the past 13 years since he has had pulmonary embolus in 2006. -The new clot happened while he was on Coumadin.  We do not know if the INR was therapeutic at that time. That she was started on Xarelto on 9 26,019.  This was later switched to Eliquis. -She is taking Eliquis 5 mg twice daily. -He reported weakness since he started Eliquis. -I have offered him to cut back on Eliquis to 2.5 mg twice daily as he completed 6 months of full dose anticoagulation. -He was reluctant to decrease the dose at the time. -He is still taking Eliquis twice daily.  3.  Mediastinal adenopathy: -PET CT scan in 2018 showed mediastinal adenopathy. -CT scan of the chest on 04/13/2018 showed complete resolution.      Orders placed this encounter:  Orders Placed This Encounter  Procedures  . NM PET Image Restag (PS) Skull Base To Thigh  . CBC with Differential/Platelet  . Comprehensive metabolic panel  . Lactate dehydrogenase  . Lactate dehydrogenase  . Protein electrophoresis, serum  . Kappa/lambda light chains      Francene Finders, FNP-C [place] [phone]

## 2019-11-20 NOTE — Assessment & Plan Note (Addendum)
1.  Smoldering myeloma: -Bone marrow biopsy on 01/07/2016 at [facility] showing trilineage hematopoiesis, plasma cells 13% FISH panel showed an additional IGH/14 q. signal.  Gain of chromosome 9 and CCN D1/11 q. 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 -PET scan in January 2018 did not reveal any suspicious lesions from myeloma.  He denies any bone pain. -Skeletal survey on 04/22/2018 - for any lytic lesions. -Myeloma labs from 11/08/2019 revealed M spike of 4.3 g/dL, IgG level of 6183, IFE positive for IgG kappa light chain specificity, light chain elevated at 236.9, lambda 14.1, ratio 16.8, he has mild anemia with hemoglobin of 10.9.  Creatinine has gone up to 1.34. -Repeat of his skeletal survey done on 11/08/2019 showed new small lytic lesions in the proximal right and left humerus, proximal left tibia, consistent with myeloma. -We will order a PET CT scan and he will follow up afterwards  2.  Recurrent left leg DVT: -Doppler on 08/09/2018 showed positive acute thrombus in the left popliteal and femoral vein. -Patient has been on Coumadin for the past 13 years since he has had pulmonary embolus in 2006. -The new clot happened while he was on Coumadin.  We do not know if the INR was therapeutic at that time. That she was started on Xarelto on 9 26,019.  This was later switched to Eliquis. -She is taking Eliquis 5 mg twice daily. -He reported weakness since he started Eliquis. -I have offered him to cut back on Eliquis to 2.5 mg twice daily as he completed 6 months of full dose anticoagulation. -He was reluctant to decrease the dose at the time. -He is still taking Eliquis twice daily.  3.  Mediastinal adenopathy: -PET CT scan in 2018 showed mediastinal adenopathy. -CT scan of the chest on 04/13/2018 showed complete resolution.

## 2019-11-20 NOTE — Patient Instructions (Signed)
[place] at [facility] Discharge Instructions  Follow-up after PET CT scan   Thank you for choosing [place] at [facility] to provide your oncology and hematology care.  To afford each patient quality time with our provider, please arrive at least 15 minutes before your scheduled appointment time.   If you have a lab appointment with the [place] [facility] please come in thru the Main Entrance and check in at the main information desk.  You need to re-schedule your appointment should you arrive 10 or more minutes late.  We strive to give you quality time with our providers, and arriving late affects you and other patients whose appointments are after yours.  Also, if you no show three or more times for appointments you may be dismissed from the clinic at the providers discretion.     Again, thank you for choosing [facility].  Our hope is that these requests will decrease the amount of time that you wait before being seen by our physicians.       _____________________________________________________________  Should you have questions after your visit to [facility], please contact our office at [phone] between the hours of 8:00 a.m. and 4:30 p.m.  Voicemails left after 4:00 p.m. will not be returned until the following business day.  For prescription refill requests, have your pharmacy contact our office and allow 72 hours.    Due to Covid, you will need to wear a mask upon entering the hospital. If you do not have a mask, a mask will be given to you at the Main Entrance upon arrival. For doctor visits, patients may have 1 support person with them. For treatment visits, patients can not have anyone with them due to social distancing guidelines and our immunocompromised population.

## 2019-12-07 ENCOUNTER — Inpatient Hospital Stay (HOSPITAL_COMMUNITY): Payer: Medicare FFS

## 2019-12-10 ENCOUNTER — Ambulatory Visit (HOSPITAL_COMMUNITY): Payer: Medicare FFS

## 2019-12-12 ENCOUNTER — Ambulatory Visit (HOSPITAL_COMMUNITY): Payer: Medicare FFS | Admitting: Hematology

## 2019-12-17 ENCOUNTER — Ambulatory Visit (HOSPITAL_COMMUNITY): Payer: Medicare FFS | Admitting: Hematology

## 2019-12-24 ENCOUNTER — Other Ambulatory Visit: Payer: Self-pay

## 2019-12-24 ENCOUNTER — Ambulatory Visit (HOSPITAL_COMMUNITY)
Admission: RE | Admit: 2019-12-24 | Discharge: 2019-12-24 | Disposition: A | Discharge status: Home / Self Care | Payer: Medicare FFS | Source: Ambulatory Visit | Attending: Nurse Practitioner | Admitting: Nurse Practitioner

## 2019-12-24 ENCOUNTER — Inpatient Hospital Stay (HOSPITAL_COMMUNITY): Payer: Medicare FFS | Attending: Hematology

## 2019-12-24 DIAGNOSIS — H409 Unspecified glaucoma: Secondary | ICD-10-CM | POA: Insufficient documentation

## 2019-12-24 DIAGNOSIS — Z86718 Personal history of other venous thrombosis and embolism: Secondary | ICD-10-CM | POA: Diagnosis not present

## 2019-12-24 DIAGNOSIS — Z7901 Long term (current) use of anticoagulants: Secondary | ICD-10-CM | POA: Diagnosis not present

## 2019-12-24 DIAGNOSIS — R590 Localized enlarged lymph nodes: Secondary | ICD-10-CM | POA: Diagnosis not present

## 2019-12-24 DIAGNOSIS — C9000 Multiple myeloma not having achieved remission: Secondary | ICD-10-CM

## 2019-12-24 DIAGNOSIS — I10 Essential (primary) hypertension: Secondary | ICD-10-CM | POA: Diagnosis not present

## 2019-12-24 DIAGNOSIS — Z86711 Personal history of pulmonary embolism: Secondary | ICD-10-CM | POA: Diagnosis not present

## 2019-12-24 DIAGNOSIS — Z79899 Other long term (current) drug therapy: Secondary | ICD-10-CM | POA: Diagnosis not present

## 2019-12-24 LAB — COMPREHENSIVE METABOLIC PANEL
ALT: 13 U/L (ref 0–44)
AST: 23 U/L (ref 15–41)
Albumin: 3.2 g/dL — ABNORMAL LOW (ref 3.5–5.0)
Alkaline Phosphatase: 39 U/L (ref 38–126)
Anion gap: 4 — ABNORMAL LOW (ref 5–15)
BUN: 20 mg/dL (ref 8–23)
CO2: 24 mmol/L (ref 22–32)
Calcium: 8.6 mg/dL — ABNORMAL LOW (ref 8.9–10.3)
Chloride: 101 mmol/L (ref 98–111)
Creatinine, Ser: 1.45 mg/dL — ABNORMAL HIGH (ref 0.61–1.24)
GFR calc Af Amer: 50 mL/min — ABNORMAL LOW (ref >60)
GFR calc non Af Amer: 43 mL/min — ABNORMAL LOW (ref >60)
Glucose, Bld: 93 mg/dL (ref 70–99)
Potassium: 4.1 mmol/L (ref 3.5–5.1)
Sodium: 129 mmol/L — ABNORMAL LOW (ref 135–145)
Total Bilirubin: 0.7 mg/dL (ref 0.3–1.2)
Total Protein: 9.9 g/dL — ABNORMAL HIGH (ref 6.5–8.1)

## 2019-12-24 LAB — CBC WITH DIFFERENTIAL/PLATELET
Abs Immature Granulocytes: 0.01 10*3/uL (ref 0.00–0.07)
Basophils Absolute: 0 10*3/uL (ref 0.0–0.1)
Basophils Relative: 0 %
Eosinophils Absolute: 0 10*3/uL (ref 0.0–0.5)
Eosinophils Relative: 0 %
HCT: 36.5 % — ABNORMAL LOW (ref 39.0–52.0)
Hemoglobin: 11.4 g/dL — ABNORMAL LOW (ref 13.0–17.0)
Immature Granulocytes: 0 %
Lymphocytes Relative: 25 %
Lymphs Abs: 0.9 10*3/uL (ref 0.7–4.0)
MCH: 31.9 pg (ref 26.0–34.0)
MCHC: 31.2 g/dL (ref 30.0–36.0)
MCV: 102.2 fL — ABNORMAL HIGH (ref 80.0–100.0)
Monocytes Absolute: 0.4 10*3/uL (ref 0.1–1.0)
Monocytes Relative: 12 %
Neutro Abs: 2.2 10*3/uL (ref 1.7–7.7)
Neutrophils Relative %: 63 %
Platelets: 168 10*3/uL (ref 150–400)
RBC: 3.57 MIL/uL — ABNORMAL LOW (ref 4.22–5.81)
RDW: 15.5 % (ref 11.5–15.5)
WBC: 3.5 10*3/uL — ABNORMAL LOW (ref 4.0–10.5)
nRBC: 0 % (ref 0.0–0.2)

## 2019-12-24 LAB — LACTATE DEHYDROGENASE: LDH: 161 U/L (ref 98–192)

## 2019-12-24 MED ORDER — FLUDEOXYGLUCOSE F - 18 (FDG) INJECTION
10.8200 | Freq: Once | INTRAVENOUS | Status: AC | PRN
  Administered 2019-12-24: 10.82 via INTRAVENOUS

## 2019-12-25 LAB — KAPPA/LAMBDA LIGHT CHAINS
Kappa free light chain: 173.4 mg/L — ABNORMAL HIGH (ref 3.3–19.4)
Kappa, lambda light chain ratio: 9.58 — ABNORMAL HIGH (ref 0.26–1.65)
Lambda free light chains: 18.1 mg/L (ref 5.7–26.3)

## 2019-12-26 LAB — PROTEIN ELECTROPHORESIS, SERUM
A/G Ratio: 0.6 — ABNORMAL LOW (ref 0.7–1.7)
Albumin ELP: 3.7 g/dL (ref 2.9–4.4)
Alpha-1-Globulin: 0.2 g/dL (ref 0.0–0.4)
Alpha-2-Globulin: 0.6 g/dL (ref 0.4–1.0)
Beta Globulin: 0.9 g/dL (ref 0.7–1.3)
Gamma Globulin: 4.6 g/dL — ABNORMAL HIGH (ref 0.4–1.8)
Globulin, Total: 6.3 g/dL — ABNORMAL HIGH (ref 2.2–3.9)
M-Spike, %: 4.4 g/dL — ABNORMAL HIGH
Total Protein ELP: 10 g/dL — ABNORMAL HIGH (ref 6.0–8.5)

## 2019-12-27 ENCOUNTER — Ambulatory Visit (HOSPITAL_COMMUNITY): Payer: Medicare FFS | Admitting: Hematology

## 2020-01-17 ENCOUNTER — Encounter (HOSPITAL_COMMUNITY): Payer: Self-pay | Admitting: Hematology

## 2020-01-17 ENCOUNTER — Inpatient Hospital Stay (HOSPITAL_COMMUNITY): Payer: Medicare FFS | Admitting: Hematology

## 2020-01-17 ENCOUNTER — Other Ambulatory Visit: Payer: Self-pay

## 2020-01-17 VITALS — BP 119/74 | HR 75 | Temp 97.1°F | Resp 18 | Wt 180.3 lb

## 2020-01-17 DIAGNOSIS — C9000 Multiple myeloma not having achieved remission: Secondary | ICD-10-CM | POA: Diagnosis not present

## 2020-01-17 NOTE — Patient Instructions (Addendum)
[place] at [facility] Discharge Instructions  You were seen today by Dr. Delton Coombes. He talked with you about your recent labs and everything is stable. We will see you back in 4 months with labs one week before.      Thank you for choosing [place] at [facility] to provide your oncology and hematology care.  To afford each patient quality time with our provider, please arrive at least 15 minutes before your scheduled appointment time.   If you have a lab appointment with the [place] please come in thru the Main Entrance and check in at the main information desk.  You need to re-schedule your appointment should you arrive 10 or more minutes late.  We strive to give you quality time with our providers, and arriving late affects you and other patients whose appointments are after yours.  Also, if you no show three or more times for appointments you may be dismissed from the clinic at the providers discretion.     Again, thank you for choosing [facility].  Our hope is that these requests will decrease the amount of time that you wait before being seen by our physicians.       _____________________________________________________________  Should you have questions after your visit to [facility], please contact our office at [phone] between the hours of 8:00 a.m. and 4:30 p.m.  Voicemails left after 4:00 p.m. will not be returned until the following business day.  For prescription refill requests, have your pharmacy contact our office and allow 72 hours.    Due to Covid, you will need to wear a mask upon entering the hospital. If you do not have a mask, a mask will be given to you at the Main Entrance upon arrival. For doctor visits, patients may have 1 support person with them. For treatment visits, patients can not have anyone with them due to social distancing guidelines and our immunocompromised population.

## 2020-01-18 ENCOUNTER — Encounter (HOSPITAL_COMMUNITY): Payer: Self-pay | Admitting: Hematology

## 2020-01-18 NOTE — Assessment & Plan Note (Signed)
1.  Smoldering myeloma: -BMBX on 01/07/2016 at [facility] showed trilineage hematopoiesis, plasma cells 13%, FISH panel showed an additional IGH/14 q. signal.  Gain of 9 chromosome and CCN D1/11 q.  Chromosome analysis 48, XY. -We reviewed myeloma panel from 12/24/2019.  SPEP is stable at 4.4 g.  Hemoglobin is 11.4.  Creatinine is also stable at 1.45.  Calcium is 8.6. -Free kappa light chains are 173, previously 236.  Ratio is 9.58, previously 16.8. -I reviewed PET scan from 12/24/2019.  No lytic lesions or suspicious lesions for myeloma or plasmacytoma. -We have been following him conservatively because of his advanced age.  There is no indication for treatment at this time. -We will see him back in 4 months with repeat labs.  2.  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 -Doppler on 08/09/2018 showed acute thrombus in the left popliteal and femoral vein.  Coumadin was changed to Xarelto, which was later switched to Eliquis. -He is tolerating Eliquis twice daily very well.  No bleeding issues.  3.  Mediastinal adenopathy: -PET scan on 12/24/2019 did not show any mediastinal adenopathy.  However it was seen on PET scan in 2018.

## 2020-01-18 NOTE — Progress Notes (Signed)
[place], [place] [address]   CLINIC:  Medical Oncology/Hematology  PCP:  Neale Burly, MD [place] [phone]   REASON FOR VISIT: Follow-up for smoldering myeloma  CURRENT THERAPY: Observation   INTERVAL HISTORY:  Dale Suarez 86 y.o. male seen for follow-up of smoldering myeloma.  Denies any new onset pains.  Continuing Eliquis without any problems.  No bleeding issues reported.  Appetite and energy levels are 100%.  No fevers or infections reported.  No recent ER visits or hospitalizations.  Shortness of breath on exertion is stable.    REVIEW OF SYSTEMS:  Review of Systems  Respiratory: Positive for shortness of breath.   All other systems reviewed and are negative.    PAST MEDICAL/SURGICAL HISTORY:  Past Medical History:  Diagnosis Date  . Anemia   . Glaucoma   . Hypertension    History reviewed. No pertinent surgical history.   SOCIAL HISTORY:  Social History   Socioeconomic History  . Marital status: Widowed    Spouse name: Not on file  . Number of children: Not on file  . Years of education: Not on file  . Highest education level: Not on file  Occupational History  . Not on file  Tobacco Use  . Smoking status: Never Smoker  . Smokeless tobacco: Never Used  Substance and Sexual Activity  . Alcohol use: No  . Drug use: No  . Sexual activity: Not Currently  Other Topics Concern  . Not on file  Social History Narrative  . Not on file   Social Determinants of Health   Financial Resource Strain:   . Difficulty of Paying Living Expenses:   Food Insecurity:   . Worried About Charity fundraiser in the Last Year:   . Arboriculturist in the Last Year:   Transportation Needs:   . Film/video editor (Medical):   Marland Kitchen Lack of Transportation (Non-Medical):   Physical Activity:   . Days of Exercise per Week:   . Minutes of Exercise per Session:   Stress:   . Feeling of  Stress :   Social Connections:   . Frequency of Communication with Friends and Family:   . Frequency of Social Gatherings with Friends and Family:   . Attends Religious Services:   . Active Member of Clubs or Organizations:   . Attends Archivist Meetings:   Marland Kitchen Marital Status:   Intimate Partner Violence:   . Fear of Current or Ex-Partner:   . Emotionally Abused:   Marland Kitchen Physically Abused:   . Sexually Abused:     FAMILY HISTORY:  Family History  Problem Relation Age of Onset  . Hypertension Mother   . Heart attack Mother   . Heart attack Father   . Stroke Sister   . Alcoholism Brother   . Cirrhosis Brother     CURRENT MEDICATIONS:  Outpatient Encounter Medications as of 01/17/2020  Medication Sig Note  . Cholecalciferol (VITAMIN D3) 2000 units TABS Take 2,000 Units by mouth daily.    . cyanocobalamin 500 MCG tablet Take 500 mcg by mouth daily.   . dorzolamide (TRUSOPT) 2 % ophthalmic solution INSTILL ONE DROP IN [facility] EYE TWICE DAILY   . doxazosin (CARDURA) 8 MG tablet Take 8 mg by mouth daily.    . Ferrous Gluconate (IRON 27 PO) Take 1 tablet every morning by mouth.   . Glucosamine Sulfate 1000 MG CAPS  Take by mouth. Takes twice a day   . hydrochlorothiazide (HYDRODIURIL) 50 MG tablet Take 50 mg by mouth daily.    Marland Kitchen latanoprost (XALATAN) 0.005 % ophthalmic solution Place 1 drop into both eyes at bedtime.   Marland Kitchen levocetirizine (XYZAL) 5 MG tablet Take 5 mg by mouth daily.   Marland Kitchen loratadine (CLARITIN) 10 MG tablet Take 10 mg by mouth daily.    Marland Kitchen warfarin (COUMADIN) 5 MG tablet TAKE 1 TABLET BY MOUTH EVERY OTHER DAY ALTERNATING WITH ONE & 1/2 TABLETS EVERY OTHER DAY   . albuterol (PROVENTIL HFA;VENTOLIN HFA) 108 (90 Base) MCG/ACT inhaler Inhale into the lungs. 06/29/2016: Received from: [place]: Inhale 2 puffs into the lungs as needed for Wheezing.  Marland Kitchen ketoconazole (NIZORAL) 2 % cream 2 (two) times daily. as directed    No facility-administered encounter  medications on file as of 01/17/2020.    ALLERGIES:  No Known Allergies   PHYSICAL EXAM:  ECOG Performance status: 1  Vitals:   01/17/20 1454  BP: 119/74  Pulse: 75  Resp: 18  Temp: (!) 97.1 F (36.2 C)  SpO2: 98%   Filed Weights   01/17/20 1454  Weight: 180 lb 4.8 oz (81.8 kg)    Physical Exam Constitutional:      Appearance: He is well-developed.  Cardiovascular:     Rate and Rhythm: Normal rate and regular rhythm.     Heart sounds: Normal heart sounds.  Pulmonary:     Effort: Pulmonary effort is normal.     Breath sounds: Normal breath sounds.  Musculoskeletal:        General: Normal range of motion.  Skin:    General: Skin is warm and dry.  Neurological:     Mental Status: He is alert and oriented to person, place, and time.  Psychiatric:        Behavior: Behavior normal.        Thought Content: Thought content normal.        Judgment: Judgment normal.      LABORATORY DATA:  I have reviewed the labs as listed.  CBC    Component Value Date/Time   WBC 3.5 (L) 12/24/2019 1338   RBC 3.57 (L) 12/24/2019 1338   HGB 11.4 (L) 12/24/2019 1338   HCT 36.5 (L) 12/24/2019 1338   PLT 168 12/24/2019 1338   MCV 102.2 (H) 12/24/2019 1338   MCH 31.9 12/24/2019 1338   MCHC 31.2 12/24/2019 1338   RDW 15.5 12/24/2019 1338   LYMPHSABS 0.9 12/24/2019 1338   MONOABS 0.4 12/24/2019 1338   EOSABS 0.0 12/24/2019 1338   BASOSABS 0.0 12/24/2019 1338   CMP Latest Ref Rng & Units 12/24/2019 11/08/2019 08/09/2019  Glucose 70 - 99 mg/dL 93 75 75  BUN 8 - 23 mg/dL _0 Creatinine 0.61 - 1.24 mg/dL 1.45(H) 1.34(H) 1.22  Sodium 135 - 145 mmol/L 129(L) 132(L) 133(L)  Potassium 3.5 - 5.1 mmol/L 4.1 4.0 3.8  Chloride 98 - 111 mmol/L 101 104 104  CO2 22 - 32 mmol/L _1 Calcium 8.9 - 10.3 mg/dL 8.6(L) 8.7(L) 8.7(L)  Total Protein 6.5 - 8.1 g/dL 9.9(H) 10.2(H) 10.0(H)  Total Bilirubin 0.3 - 1.2 mg/dL 0.7 0.6 0.4  Alkaline Phos 38 - 126 U/L 39 42 46  AST 15 - 41 U/L _2 ALT 0 - 44 U/L _3 DIAGNOSTIC IMAGING:  I have independently reviewed the scans.  ASSESSMENT & PLAN:   Multiple myeloma ([place]) 1.  Smoldering myeloma: -BMBX on 01/07/2016 at [facility] showed trilineage hematopoiesis, plasma cells 13%, FISH panel showed an additional IGH/14 q. signal.  Gain of 9 chromosome and CCN D1/11 q.  Chromosome analysis 48, XY. -We reviewed myeloma panel from 12/24/2019.  SPEP is stable at 4.4 g.  Hemoglobin is 11.4.  Creatinine is also stable at 1.45.  Calcium is 8.6. -Free kappa light chains are 173, previously 236.  Ratio is 9.58, previously 16.8. -I reviewed PET scan from 12/24/2019.  No lytic lesions or suspicious lesions for myeloma or plasmacytoma. -We have been following him conservatively because of his advanced age.  There is no indication for treatment at this time. -We will see him back in 4 months with repeat labs.  2.  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 -Doppler on 08/09/2018 showed acute thrombus in the left popliteal and femoral vein.  Coumadin was changed to Xarelto, which was later switched to Eliquis. -He is tolerating Eliquis twice daily very well.  No bleeding issues.  3.  Mediastinal adenopathy: -PET scan on 12/24/2019 did not show any mediastinal adenopathy.  However it was seen on PET scan in 2018.      Orders placed this encounter:  Orders Placed This Encounter  Procedures  . CBC with Differential/Platelet  . Comprehensive metabolic panel  . Protein electrophoresis, serum  . Kappa/lambda light chains      Derek Jack, [place] [phone]

## 2020-02-05 ENCOUNTER — Other Ambulatory Visit (HOSPITAL_COMMUNITY): Payer: Self-pay | Admitting: Respiratory Therapy

## 2020-02-05 DIAGNOSIS — J4489 Other specified chronic obstructive pulmonary disease: Secondary | ICD-10-CM

## 2020-02-05 DIAGNOSIS — J449 Chronic obstructive pulmonary disease, unspecified: Secondary | ICD-10-CM

## 2020-05-12 ENCOUNTER — Other Ambulatory Visit: Payer: Self-pay

## 2020-05-12 ENCOUNTER — Inpatient Hospital Stay (HOSPITAL_COMMUNITY): Payer: Medicare FFS | Attending: Hematology

## 2020-05-12 DIAGNOSIS — C9000 Multiple myeloma not having achieved remission: Secondary | ICD-10-CM

## 2020-05-12 DIAGNOSIS — E611 Iron deficiency: Secondary | ICD-10-CM | POA: Diagnosis not present

## 2020-05-12 DIAGNOSIS — Z7901 Long term (current) use of anticoagulants: Secondary | ICD-10-CM | POA: Diagnosis not present

## 2020-05-12 DIAGNOSIS — I82432 Acute embolism and thrombosis of left popliteal vein: Secondary | ICD-10-CM | POA: Diagnosis not present

## 2020-05-12 DIAGNOSIS — Z86718 Personal history of other venous thrombosis and embolism: Secondary | ICD-10-CM | POA: Insufficient documentation

## 2020-05-12 DIAGNOSIS — I82412 Acute embolism and thrombosis of left femoral vein: Secondary | ICD-10-CM | POA: Insufficient documentation

## 2020-05-12 LAB — COMPREHENSIVE METABOLIC PANEL
ALT: 11 U/L (ref 0–44)
AST: 23 U/L (ref 15–41)
Albumin: 3.2 g/dL — ABNORMAL LOW (ref 3.5–5.0)
Alkaline Phosphatase: 42 U/L (ref 38–126)
Anion gap: 4 — ABNORMAL LOW (ref 5–15)
BUN: 17 mg/dL (ref 8–23)
CO2: 23 mmol/L (ref 22–32)
Calcium: 8.5 mg/dL — ABNORMAL LOW (ref 8.9–10.3)
Chloride: 105 mmol/L (ref 98–111)
Creatinine, Ser: 1.35 mg/dL — ABNORMAL HIGH (ref 0.61–1.24)
GFR calc Af Amer: 54 mL/min — ABNORMAL LOW (ref >60)
GFR calc non Af Amer: 47 mL/min — ABNORMAL LOW (ref >60)
Glucose, Bld: 84 mg/dL (ref 70–99)
Potassium: 3.9 mmol/L (ref 3.5–5.1)
Sodium: 132 mmol/L — ABNORMAL LOW (ref 135–145)
Total Bilirubin: 0.5 mg/dL (ref 0.3–1.2)
Total Protein: 10.1 g/dL — ABNORMAL HIGH (ref 6.5–8.1)

## 2020-05-12 LAB — CBC WITH DIFFERENTIAL/PLATELET
Abs Immature Granulocytes: 0.01 K/uL (ref 0.00–0.07)
Basophils Absolute: 0 K/uL (ref 0.0–0.1)
Basophils Relative: 1 %
Eosinophils Absolute: 0 K/uL (ref 0.0–0.5)
Eosinophils Relative: 1 %
HCT: 33.1 % — ABNORMAL LOW (ref 39.0–52.0)
Hemoglobin: 10.6 g/dL — ABNORMAL LOW (ref 13.0–17.0)
Immature Granulocytes: 0 %
Lymphocytes Relative: 40 %
Lymphs Abs: 1.4 K/uL (ref 0.7–4.0)
MCH: 31.9 pg (ref 26.0–34.0)
MCHC: 32 g/dL (ref 30.0–36.0)
MCV: 99.7 fL (ref 80.0–100.0)
Monocytes Absolute: 0.6 K/uL (ref 0.1–1.0)
Monocytes Relative: 18 %
Neutro Abs: 1.3 K/uL — ABNORMAL LOW (ref 1.7–7.7)
Neutrophils Relative %: 40 %
Platelets: 168 K/uL (ref 150–400)
RBC: 3.32 MIL/uL — ABNORMAL LOW (ref 4.22–5.81)
RDW: 16.4 % — ABNORMAL HIGH (ref 11.5–15.5)
WBC: 3.4 K/uL — ABNORMAL LOW (ref 4.0–10.5)
nRBC: 0 % (ref 0.0–0.2)

## 2020-05-13 LAB — PROTEIN ELECTROPHORESIS, SERUM
A/G Ratio: 0.6 — ABNORMAL LOW (ref 0.7–1.7)
Albumin ELP: 3.9 g/dL (ref 2.9–4.4)
Alpha-1-Globulin: 0.2 g/dL (ref 0.0–0.4)
Alpha-2-Globulin: 0.6 g/dL (ref 0.4–1.0)
Beta Globulin: 0.8 g/dL (ref 0.7–1.3)
Gamma Globulin: 4.5 g/dL — ABNORMAL HIGH (ref 0.4–1.8)
Globulin, Total: 6.2 g/dL — ABNORMAL HIGH (ref 2.2–3.9)
M-Spike, %: 4.3 g/dL — ABNORMAL HIGH
Total Protein ELP: 10.1 g/dL — ABNORMAL HIGH (ref 6.0–8.5)

## 2020-05-13 LAB — KAPPA/LAMBDA LIGHT CHAINS
Kappa free light chain: 246.5 mg/L — ABNORMAL HIGH (ref 3.3–19.4)
Kappa, lambda light chain ratio: 16.54 — ABNORMAL HIGH (ref 0.26–1.65)
Lambda free light chains: 14.9 mg/L (ref 5.7–26.3)

## 2020-05-19 ENCOUNTER — Inpatient Hospital Stay (HOSPITAL_BASED_OUTPATIENT_CLINIC_OR_DEPARTMENT_OTHER): Payer: Medicare FFS | Admitting: Hematology

## 2020-05-19 VITALS — BP 118/73 | HR 77 | Temp 99.3°F | Resp 16 | Wt 181.6 lb

## 2020-05-19 DIAGNOSIS — C9000 Multiple myeloma not having achieved remission: Secondary | ICD-10-CM | POA: Diagnosis not present

## 2020-05-19 NOTE — Patient Instructions (Signed)
[facility] at [facility] °Discharge Instructions ° °You were seen today by Dr. Katragadda. He went over your recent results. Dr. Katragadda will see you back in 4 months for labs and follow up. ° ° °Thank you for choosing [facility] at [facility] to provide your oncology and hematology care.  To afford each patient quality time with our provider, please arrive at least 15 minutes before your scheduled appointment time.  ° °If you have a lab appointment with the Cancer Center please come in thru the Main Entrance and check in at the main information desk ° °You need to re-schedule your appointment should you arrive 10 or more minutes late.  We strive to give you quality time with our providers, and arriving late affects you and other patients whose appointments are after yours.  Also, if you no show three or more times for appointments you may be dismissed from the clinic at the providers discretion.     °Again, thank you for choosing [facility].  Our hope is that these requests will decrease the amount of time that you wait before being seen by our physicians.       °_____________________________________________________________ ° °Should you have questions after your visit to [facility], please contact our office at [phone] between the hours of 8:00 a.m. and 4:30 p.m.  Voicemails left after 4:00 p.m. will not be returned until the following business day.  For prescription refill requests, have your pharmacy contact our office and allow 72 hours.   ° °Cancer Center Support Programs:  ° °> Cancer Support Group  °2nd Tuesday of the month 1pm-2pm, Journey Room  ° ° °

## 2020-05-19 NOTE — Progress Notes (Signed)
 [facility] [address] , [place] [address]   CLINIC:  Medical Oncology/Hematology  PCP:  Hasanaj, Xaje A, MD [address] / [place] [id number]  [phone]  REASON FOR VISIT:  Follow-up for smoldering myeloma  PRIOR THERAPY: None  CURRENT THERAPY: observation  INTERVAL HISTORY:  [name], a 86 y.o. 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 Dale Suarez was last seen on 01/17/2020.  He has been taking 45mg of iron every other day. He states that it constipates him some, which is a reason why he started easing off on it. He has not tried taking stool softener daily. He has been eating prunes everyday to help with his bowels, however.    REVIEW OF SYSTEMS:  Review of Systems  Constitutional: Negative.   HENT:  Negative.   Eyes: Negative.   Respiratory: Negative.   Cardiovascular: Negative.   Gastrointestinal: Positive for constipation (with oral iron).  Endocrine: Negative.   Genitourinary: Negative.    Musculoskeletal: Negative.   Skin: Negative.   Neurological: Negative.   Hematological: Negative.   Psychiatric/Behavioral: Negative.   All other systems reviewed and are negative.   PAST MEDICAL/SURGICAL HISTORY:  Past Medical History:  Diagnosis Date  . Anemia   . Glaucoma   . Hypertension    No past surgical history on file.  SOCIAL HISTORY:  Social History   Socioeconomic History  . Marital status: Widowed    Spouse name: Not on file  . Number of children: Not on file  . Years of education: Not on file  . Highest education level: Not on file  Occupational History  . Not on file  Tobacco Use  . Smoking status: Never Smoker  . Smokeless tobacco: Never Used  Substance and Sexual Activity  . Alcohol use: No  . Drug use: No  . Sexual activity: Not Currently  Other Topics Concern  . Not on file  Social History Narrative  . Not on file   Social Determinants of Health   Financial Resource  Strain:   . Difficulty of Paying Living Expenses:   Food Insecurity:   . Worried About Running Out of Food in the Last Year:   . Ran Out of Food in the Last Year:   Transportation Needs:   . Lack of Transportation (Medical):   . Lack of Transportation (Non-Medical):   Physical Activity:   . Days of Exercise per Week:   . Minutes of Exercise per Session:   Stress:   . Feeling of Stress :   Social Connections:   . Frequency of Communication with Friends and Family:   . Frequency of Social Gatherings with Friends and Family:   . Attends Religious Services:   . Active Member of Clubs or Organizations:   . Attends Club or Organization Meetings:   . Marital Status:   Intimate Partner Violence:   . Fear of Current or Ex-Partner:   . Emotionally Abused:   . Physically Abused:   . Sexually Abused:     FAMILY HISTORY:  Family History  Problem Relation Age of Onset  . Hypertension Mother   . Heart attack Mother   . Heart attack Father   . Stroke Sister   . Alcoholism Brother   . Cirrhosis Brother     CURRENT MEDICATIONS:  Current Outpatient Medications  Medication Sig Dispense Refill  . Cholecalciferol (VITAMIN D3) 2000 units TABS Take 2,000 Units by mouth daily.     .   cyanocobalamin 500 MCG tablet Take 500 mcg by mouth daily.    . dorzolamide (TRUSOPT) 2 % ophthalmic solution INSTILL ONE DROP IN EACH EYE TWICE DAILY  2  . doxazosin (CARDURA) 8 MG tablet Take 8 mg by mouth daily.     . Ferrous Gluconate (IRON 27 PO) Take 1 tablet every morning by mouth.    . Glucosamine Sulfate 1000 MG CAPS Take by mouth. Takes twice a day    . hydrochlorothiazide (HYDRODIURIL) 50 MG tablet Take 50 mg by mouth daily.     . latanoprost (XALATAN) 0.005 % ophthalmic solution Place 1 drop into both eyes at bedtime.    . loratadine (CLARITIN) 10 MG tablet Take 10 mg by mouth daily.     . warfarin (COUMADIN) 5 MG tablet TAKE 1 TABLET BY MOUTH EVERY OTHER DAY ALTERNATING WITH ONE & 1/2 TABLETS EVERY  OTHER DAY    . albuterol (PROVENTIL HFA;VENTOLIN HFA) 108 (90 Base) MCG/ACT inhaler Inhale into the lungs. (Patient not taking: Reported on 05/19/2020)     No current facility-administered medications for this visit.    ALLERGIES:  No Known Allergies  PHYSICAL EXAM:  Performance status (ECOG): 1 - Symptomatic but completely ambulatory  Vitals:   05/19/20 1401  BP: 118/73  Pulse: 77  Resp: 16  Temp: 99.3 F (37.4 C)  SpO2: 99%   Wt Readings from Last 3 Encounters:  05/19/20 181 lb 9.6 oz (82.4 kg)  01/17/20 180 lb 4.8 oz (81.8 kg)  11/20/19 180 lb 3.2 oz (81.7 kg)   Physical Exam Vitals reviewed.  Constitutional:      Appearance: Normal appearance.  HENT:     Mouth/Throat:     Mouth: Mucous membranes are moist.  Eyes:     Pupils: Pupils are equal, round, and reactive to light.  Cardiovascular:     Rate and Rhythm: Normal rate and regular rhythm.     Pulses: Normal pulses.     Heart sounds: Normal heart sounds.  Pulmonary:     Effort: Pulmonary effort is normal.     Breath sounds: Normal breath sounds.  Abdominal:     Palpations: Abdomen is soft. There is no mass.     Tenderness: There is no abdominal tenderness.  Musculoskeletal:     Right lower leg: No edema.     Left lower leg: 1+ Edema present.  Neurological:     Mental Status: He is alert and oriented to person, place, and time.  Psychiatric:        Mood and Affect: Mood normal.        Behavior: Behavior normal.     LABORATORY DATA:  I have reviewed the labs as listed.  CBC Latest Ref Rng & Units 05/12/2020 12/24/2019 11/08/2019  WBC 4.0 - 10.5 K/uL 3.4(L) 3.5(L) 3.3(L)  Hemoglobin 13.0 - 17.0 g/dL 10.6(L) 11.4(L) 10.9(L)  Hematocrit 39 - 52 % 33.1(L) 36.5(L) 35.9(L)  Platelets 150 - 400 K/uL 168 168 191   CMP Latest Ref Rng & Units 05/12/2020 12/24/2019 11/08/2019  Glucose 70 - 99 mg/dL 84 93 75  BUN 8 - 23 mg/dL 17 20 17  Creatinine 0.61 - 1.24 mg/dL 1.35(H) 1.45(H) 1.34(H)  Sodium 135 - 145 mmol/L  132(L) 129(L) 132(L)  Potassium 3.5 - 5.1 mmol/L 3.9 4.1 4.0  Chloride 98 - 111 mmol/L 105 101 104  CO2 22 - 32 mmol/L 23 24 26  Calcium 8.9 - 10.3 mg/dL 8.5(L) 8.6(L) 8.7(L)  Total Protein 6.5 - 8.1 g/dL 10.1(H)   9.9(H) 10.2(H)  Total Bilirubin 0.3 - 1.2 mg/dL 0.5 0.7 0.6  Alkaline Phos 38 - 126 U/L 42 39 42  AST 15 - 41 U/L 23 23 25  ALT 0 - 44 U/L 11 13 13      Component Value Date/Time   RBC 3.32 (L) 05/12/2020 1105   MCV 99.7 05/12/2020 1105   MCH 31.9 05/12/2020 1105   MCHC 32.0 05/12/2020 1105   RDW 16.4 (H) 05/12/2020 1105   LYMPHSABS 1.4 05/12/2020 1105   MONOABS 0.6 05/12/2020 1105   EOSABS 0.0 05/12/2020 1105   BASOSABS 0.0 05/12/2020 1105    DIAGNOSTIC IMAGING:  I have independently reviewed the scans and discussed with the patient. No results found.   ASSESSMENT:  1.  Smoldering myeloma: -BMBX on 01/07/2016 at [facility] showed trilineage hematopoiesis, plasma cells 13%, FISH panel showed an additional IGH/14 q. signal.  Gain of 9 chromosome and CCN D1/11 q.  Chromosome analysis 48, XY. -PET scan on 12/24/2019 showed no lytic lesions or suspicious lesions for myeloma.  2.  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 -Doppler on 08/09/2018 showed acute thrombus in the left popliteal and femoral vein.  Coumadin was changed to Xarelto, which was later switched to Eliquis.  3.  Mediastinal adenopathy: -PET scan on 12/24/2019 did not show any mediastinal adenopathy.  However it was seen on PET scan in 2018.   PLAN:  1.  Smoldering myeloma: -SPEP showed M spike of 4.3 g and stable.  Creatinine is 1.35 and stable.  Calcium is 8.5.  Hemoglobin is 10.6. -Kappa light chains are 246, up from 173 previously.  Ratio is 16.54, up from 9.58. -I have recommended follow-up in 4 months.  2.  Iron deficiency state: -His hemoglobin dropped to 10.6 with MCV of 99.7. -I have told him to start taking iron tablet daily.  I plan to  check iron levels at next visit.  3.  Recurrent leg DVT: -Continue Eliquis.   Orders placed this encounter:  No orders of the defined types were placed in this encounter.    Sreedhar Katragadda, MD [facility] [phone]   I, Amber Handy, am acting as a scribe for Dr. Sreedhar Katagadda.  I, Sreedhar Katragadda MD, have reviewed the above documentation for accuracy and completeness, and I agree with the above.     

## 2020-07-08 ENCOUNTER — Other Ambulatory Visit: Payer: Self-pay

## 2020-07-08 ENCOUNTER — Ambulatory Visit (HOSPITAL_COMMUNITY)
Admission: RE | Admit: 2020-07-08 | Discharge: 2020-07-08 | Disposition: A | Discharge status: Home / Self Care | Payer: Medicare FFS | Source: Ambulatory Visit | Attending: Internal Medicine | Admitting: Internal Medicine

## 2020-07-08 DIAGNOSIS — J449 Chronic obstructive pulmonary disease, unspecified: Secondary | ICD-10-CM | POA: Insufficient documentation

## 2020-07-08 LAB — PULMONARY FUNCTION TEST
FEF 25-75 Pre: 1.05 L/sec
FEF2575-%Pred-Pre: 50 %
FEV1-%Pred-Pre: 68 %
FEV1-Pre: 1.97 L
FEV1FVC-%Pred-Pre: 74 %
FEV6-%Pred-Pre: 90 %
FEV6-Pre: 3.4 L
FEV6FVC-%Pred-Pre: 99 %
FVC-%Pred-Pre: 90 %
FVC-Pre: 3.62 L
Pre FEV1/FVC ratio: 54 %
Pre FEV6/FVC Ratio: 94 %

## 2020-09-24 ENCOUNTER — Other Ambulatory Visit: Payer: Self-pay

## 2020-09-24 ENCOUNTER — Inpatient Hospital Stay (HOSPITAL_COMMUNITY): Payer: Medicare FFS | Attending: Hematology

## 2020-09-24 DIAGNOSIS — I82412 Acute embolism and thrombosis of left femoral vein: Secondary | ICD-10-CM | POA: Insufficient documentation

## 2020-09-24 DIAGNOSIS — C9000 Multiple myeloma not having achieved remission: Secondary | ICD-10-CM

## 2020-09-24 DIAGNOSIS — C9001 Multiple myeloma in remission: Secondary | ICD-10-CM | POA: Insufficient documentation

## 2020-09-24 DIAGNOSIS — Z7901 Long term (current) use of anticoagulants: Secondary | ICD-10-CM | POA: Diagnosis not present

## 2020-09-24 DIAGNOSIS — Z86711 Personal history of pulmonary embolism: Secondary | ICD-10-CM | POA: Insufficient documentation

## 2020-09-24 DIAGNOSIS — I82432 Acute embolism and thrombosis of left popliteal vein: Secondary | ICD-10-CM | POA: Insufficient documentation

## 2020-09-24 DIAGNOSIS — D509 Iron deficiency anemia, unspecified: Secondary | ICD-10-CM | POA: Diagnosis not present

## 2020-09-24 LAB — COMPREHENSIVE METABOLIC PANEL
ALT: 14 U/L (ref 0–44)
AST: 29 U/L (ref 15–41)
Albumin: 3.1 g/dL — ABNORMAL LOW (ref 3.5–5.0)
Alkaline Phosphatase: 41 U/L (ref 38–126)
Anion gap: 3 — ABNORMAL LOW (ref 5–15)
BUN: 20 mg/dL (ref 8–23)
CO2: 22 mmol/L (ref 22–32)
Calcium: 8.7 mg/dL — ABNORMAL LOW (ref 8.9–10.3)
Chloride: 101 mmol/L (ref 98–111)
Creatinine, Ser: 1.31 mg/dL — ABNORMAL HIGH (ref 0.61–1.24)
GFR, Estimated: 53 mL/min — ABNORMAL LOW (ref >60)
Glucose, Bld: 83 mg/dL (ref 70–99)
Potassium: 3.9 mmol/L (ref 3.5–5.1)
Sodium: 126 mmol/L — ABNORMAL LOW (ref 135–145)
Total Bilirubin: 0.8 mg/dL (ref 0.3–1.2)
Total Protein: 9.7 g/dL — ABNORMAL HIGH (ref 6.5–8.1)

## 2020-09-24 LAB — IRON AND TIBC
Iron: 41 ug/dL — ABNORMAL LOW (ref 45–182)
Saturation Ratios: 14 % — ABNORMAL LOW (ref 17.9–39.5)
TIBC: 285 ug/dL (ref 250–450)
UIBC: 244 ug/dL

## 2020-09-24 LAB — VITAMIN B12: Vitamin B-12: 3680 pg/mL — ABNORMAL HIGH (ref 180–914)

## 2020-09-24 LAB — FOLATE: Folate: 28.2 ng/mL (ref >5.9)

## 2020-09-24 LAB — LACTATE DEHYDROGENASE: LDH: 164 U/L (ref 98–192)

## 2020-09-24 LAB — FERRITIN: Ferritin: 39 ng/mL (ref 24–336)

## 2020-09-25 LAB — KAPPA/LAMBDA LIGHT CHAINS
Kappa free light chain: 214.6 mg/L — ABNORMAL HIGH (ref 3.3–19.4)
Kappa, lambda light chain ratio: 13.33 — ABNORMAL HIGH (ref 0.26–1.65)
Lambda free light chains: 16.1 mg/L (ref 5.7–26.3)

## 2020-09-26 LAB — PROTEIN ELECTROPHORESIS, SERUM
A/G Ratio: 0.6 — ABNORMAL LOW (ref 0.7–1.7)
Albumin ELP: 3.8 g/dL (ref 2.9–4.4)
Alpha-1-Globulin: 0.2 g/dL (ref 0.0–0.4)
Alpha-2-Globulin: 0.5 g/dL (ref 0.4–1.0)
Beta Globulin: 1.2 g/dL (ref 0.7–1.3)
Gamma Globulin: 4.8 g/dL — ABNORMAL HIGH (ref 0.4–1.8)
Globulin, Total: 6.7 g/dL — ABNORMAL HIGH (ref 2.2–3.9)
M-Spike, %: 4.4 g/dL — ABNORMAL HIGH
Total Protein ELP: 10.5 g/dL — ABNORMAL HIGH (ref 6.0–8.5)

## 2020-09-30 ENCOUNTER — Other Ambulatory Visit (HOSPITAL_COMMUNITY): Payer: Self-pay

## 2020-09-30 DIAGNOSIS — C9000 Multiple myeloma not having achieved remission: Secondary | ICD-10-CM

## 2020-09-30 DIAGNOSIS — I82412 Acute embolism and thrombosis of left femoral vein: Secondary | ICD-10-CM

## 2020-10-01 ENCOUNTER — Inpatient Hospital Stay (HOSPITAL_COMMUNITY): Payer: Medicare FFS | Admitting: Hematology and Oncology

## 2020-10-01 ENCOUNTER — Other Ambulatory Visit (HOSPITAL_COMMUNITY): Payer: Self-pay | Admitting: *Deleted

## 2020-10-01 ENCOUNTER — Other Ambulatory Visit: Payer: Self-pay

## 2020-10-01 ENCOUNTER — Inpatient Hospital Stay (HOSPITAL_COMMUNITY): Payer: Medicare FFS

## 2020-10-01 DIAGNOSIS — C9000 Multiple myeloma not having achieved remission: Secondary | ICD-10-CM

## 2020-10-01 DIAGNOSIS — C9001 Multiple myeloma in remission: Secondary | ICD-10-CM | POA: Diagnosis not present

## 2020-10-01 DIAGNOSIS — I82412 Acute embolism and thrombosis of left femoral vein: Secondary | ICD-10-CM

## 2020-10-01 LAB — CBC WITH DIFFERENTIAL/PLATELET
Abs Immature Granulocytes: 0.01 10*3/uL (ref 0.00–0.07)
Basophils Absolute: 0 10*3/uL (ref 0.0–0.1)
Basophils Relative: 0 %
Eosinophils Absolute: 0 10*3/uL (ref 0.0–0.5)
Eosinophils Relative: 1 %
HCT: 35.6 % — ABNORMAL LOW (ref 39.0–52.0)
Hemoglobin: 11.2 g/dL — ABNORMAL LOW (ref 13.0–17.0)
Immature Granulocytes: 0 %
Lymphocytes Relative: 34 %
Lymphs Abs: 1.1 10*3/uL (ref 0.7–4.0)
MCH: 33.5 pg (ref 26.0–34.0)
MCHC: 31.5 g/dL (ref 30.0–36.0)
MCV: 106.6 fL — ABNORMAL HIGH (ref 80.0–100.0)
Monocytes Absolute: 0.6 10*3/uL (ref 0.1–1.0)
Monocytes Relative: 18 %
Neutro Abs: 1.5 10*3/uL — ABNORMAL LOW (ref 1.7–7.7)
Neutrophils Relative %: 47 %
Platelets: 160 10*3/uL (ref 150–400)
RBC: 3.34 MIL/uL — ABNORMAL LOW (ref 4.22–5.81)
RDW: 15.6 % — ABNORMAL HIGH (ref 11.5–15.5)
WBC: 3.3 10*3/uL — ABNORMAL LOW (ref 4.0–10.5)
nRBC: 0 % (ref 0.0–0.2)

## 2020-10-01 NOTE — Progress Notes (Signed)
Patient Care Team: Neale Burly, MD as PCP - General (Internal Medicine)  DIAGNOSIS:  Encounter Diagnosis  Name Primary?  . Multiple myeloma, remission status unspecified ([place])      CHIEF COMPLIANT: Follow-up of smoldering myeloma  INTERVAL HISTORY: Dale Suarez is a 86 year old with above-mentioned history of smoldering myeloma and is currently under surveillance.  He reports no new health issues or concerns.  He looks remarkably well for 86 year old.  He denies any bone pain or discomfort anywhere.  He tells me that he takes an iron tablet for anemia.   ALLERGIES:  has No Known Allergies.  MEDICATIONS:  Current Outpatient Medications  Medication Sig Dispense Refill  . albuterol (PROVENTIL HFA;VENTOLIN HFA) 108 (90 Base) MCG/ACT inhaler Inhale into the lungs.     . Cholecalciferol (VITAMIN D3) 2000 units TABS Take 2,000 Units by mouth daily.     . cyanocobalamin 500 MCG tablet Take 500 mcg by mouth daily.    . dorzolamide (TRUSOPT) 2 % ophthalmic solution INSTILL ONE DROP IN EACH EYE TWICE DAILY  2  . doxazosin (CARDURA) 8 MG tablet Take 8 mg by mouth daily.     . Ferrous Gluconate (IRON 27 PO) Take 1 tablet every morning by mouth.    . Glucosamine Sulfate 1000 MG CAPS Take by mouth. Takes twice a day    . hydrochlorothiazide (HYDRODIURIL) 50 MG tablet Take 50 mg by mouth daily.     Marland Kitchen latanoprost (XALATAN) 0.005 % ophthalmic solution Place 1 drop into both eyes at bedtime.    Marland Kitchen loratadine (CLARITIN) 10 MG tablet Take 10 mg by mouth daily.     Marland Kitchen warfarin (COUMADIN) 5 MG tablet TAKE 1 TABLET BY MOUTH EVERY OTHER DAY ALTERNATING WITH ONE & 1/2 TABLETS EVERY OTHER DAY     No current facility-administered medications for this visit.    PHYSICAL EXAMINATION: ECOG PERFORMANCE STATUS: 1 - Symptomatic but completely ambulatory  Vitals:   10/01/20 1409  BP: (!) 145/70  Pulse: 63  Resp: 16  Temp: (!) 97.5 F (36.4 C)  SpO2: 100%   Filed Weights   10/01/20 1409   Weight: 180 lb 12.8 oz (82 kg)      LABORATORY DATA:  I have reviewed the data as listed CMP Latest Ref Rng & Units 09/24/2020 05/12/2020 12/24/2019  Glucose 70 - 99 mg/dL 83 84 93  BUN 8 - 23 mg/dL _0 Creatinine 0.61 - 1.24 mg/dL 1.31(H) 1.35(H) 1.45(H)  Sodium 135 - 145 mmol/L 126(L) 132(L) 129(L)  Potassium 3.5 - 5.1 mmol/L 3.9 3.9 4.1  Chloride 98 - 111 mmol/L 101 105 101  CO2 22 - 32 mmol/L _1 Calcium 8.9 - 10.3 mg/dL 8.7(L) 8.5(L) 8.6(L)  Total Protein 6.5 - 8.1 g/dL 9.7(H) 10.1(H) 9.9(H)  Total Bilirubin 0.3 - 1.2 mg/dL 0.8 0.5 0.7  Alkaline Phos 38 - 126 U/L 41 42 39  AST 15 - 41 U/L _2 ALT 0 - 44 U/L _3 Lab Results  Component Value Date   WBC 3.3 (L) 10/01/2020   HGB 11.2 (L) 10/01/2020   HCT 35.6 (L) 10/01/2020   MCV 106.6 (H) 10/01/2020   PLT 160 10/01/2020   NEUTROABS 1.5 (L) 10/01/2020    ASSESSMENT & PLAN:  Multiple myeloma ([facility]) Smoldering myeloma: -BMBX on 01/07/2016 at [facility] showed trilineage hematopoiesis, plasma cells 13%, FISH panel showed an additional IGH/14 q. signal. Gain of 9 chromosome and CCN  D1/11 q. Chromosome analysis 48, XY. -PET scan on 12/24/2019 showed no lytic lesions or suspicious lesions for myeloma. 2. 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 -Doppler on 08/09/2018 showed acute thrombus in the left popliteal and femoral vein. Coumadin was changed to Xarelto, which was later switched to Eliquis.  3. Mediastinal adenopathy: -PET scan on 12/24/2019 did not show any mediastinal adenopathy. However it was seen on PET scan in 2018.   PLAN:  1.  Smoldering myeloma: Lab review 09/24/2020: M spike 4.4 g (previously 4.3 g), kappa to 14, lambda 16.1, ratio 13.3 (previous ratio 16.5), creatinine 1.31 (previously 1.35), calcium 8.7, albumin 3.1, total protein 9.7, B12 3680, ferritin 39, iron saturation 14%, folate 28.2  CBC is unfortunately not  available Recommend continued follow-up every 4 months with labs done ahead of time  2. Iron deficiency: Currently on oral iron therapy 3. Recurrent leg DVT: Continue with Eliquis  Return to clinic in 3 months with labs done ahead of time and follow-up      No orders of the defined types were placed in this encounter.  The patient has a good understanding of the overall plan. he agrees with it. he will call with any problems that may develop before the next visit here. Total time spent: 20 mins including face to face time and time spent for planning, charting and co-ordination of care   Harriette Ohara, MD 10/01/20

## 2020-10-01 NOTE — Assessment & Plan Note (Addendum)
Smoldering myeloma: -BMBX on 01/07/2016 at [facility] showed trilineage hematopoiesis, plasma cells 13%, FISH panel showed an additional IGH/14 q. signal. Gain of 9 chromosome and CCN D1/11 q. Chromosome analysis 48, XY. -PET scan on 12/24/2019 showed no lytic lesions or suspicious lesions for myeloma. 2. 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 -Doppler on 08/09/2018 showed acute thrombus in the left popliteal and femoral vein. Coumadin was changed to Xarelto, which was later switched to Eliquis.  3. Mediastinal adenopathy: -PET scan on 12/24/2019 did not show any mediastinal adenopathy. However it was seen on PET scan in 2018.   PLAN:  1.  Smoldering myeloma: Lab review 09/24/2020: M spike 4.4 g (previously 4.3 g), kappa to 14, lambda 16.1, ratio 13.3 (previous ratio 16.5), creatinine 1.31 (previously 1.35), calcium 8.7, albumin 3.1, total protein 9.7, B12 3680, ferritin 39, iron saturation 14%, folate 28.2  CBC is unfortunately not available Recommend continued follow-up every 4 months with labs done ahead of time  2. Iron deficiency: Currently on oral iron therapy 3. Recurrent leg DVT: Continue with Eliquis  Return to clinic in 4 months with labs done ahead of time and follow-up

## 2020-12-29 ENCOUNTER — Other Ambulatory Visit: Payer: Self-pay

## 2020-12-29 ENCOUNTER — Inpatient Hospital Stay (HOSPITAL_COMMUNITY): Payer: Medicare FFS | Attending: Hematology

## 2020-12-29 DIAGNOSIS — R590 Localized enlarged lymph nodes: Secondary | ICD-10-CM | POA: Diagnosis not present

## 2020-12-29 DIAGNOSIS — E611 Iron deficiency: Secondary | ICD-10-CM | POA: Insufficient documentation

## 2020-12-29 DIAGNOSIS — Z7901 Long term (current) use of anticoagulants: Secondary | ICD-10-CM | POA: Diagnosis not present

## 2020-12-29 DIAGNOSIS — I82412 Acute embolism and thrombosis of left femoral vein: Secondary | ICD-10-CM | POA: Insufficient documentation

## 2020-12-29 DIAGNOSIS — Z86711 Personal history of pulmonary embolism: Secondary | ICD-10-CM | POA: Diagnosis not present

## 2020-12-29 DIAGNOSIS — I82432 Acute embolism and thrombosis of left popliteal vein: Secondary | ICD-10-CM | POA: Insufficient documentation

## 2020-12-29 DIAGNOSIS — D472 Monoclonal gammopathy: Secondary | ICD-10-CM | POA: Diagnosis not present

## 2020-12-29 DIAGNOSIS — C9000 Multiple myeloma not having achieved remission: Secondary | ICD-10-CM

## 2020-12-29 LAB — CBC WITH DIFFERENTIAL/PLATELET
Abs Immature Granulocytes: 0 10*3/uL (ref 0.00–0.07)
Basophils Absolute: 0 10*3/uL (ref 0.0–0.1)
Basophils Relative: 0 %
Eosinophils Absolute: 0.1 10*3/uL (ref 0.0–0.5)
Eosinophils Relative: 2 %
HCT: 37 % — ABNORMAL LOW (ref 39.0–52.0)
Hemoglobin: 11.8 g/dL — ABNORMAL LOW (ref 13.0–17.0)
Immature Granulocytes: 0 %
Lymphocytes Relative: 34 %
Lymphs Abs: 1.1 10*3/uL (ref 0.7–4.0)
MCH: 33.5 pg (ref 26.0–34.0)
MCHC: 31.9 g/dL (ref 30.0–36.0)
MCV: 105.1 fL — ABNORMAL HIGH (ref 80.0–100.0)
Monocytes Absolute: 0.6 10*3/uL (ref 0.1–1.0)
Monocytes Relative: 19 %
Neutro Abs: 1.4 10*3/uL — ABNORMAL LOW (ref 1.7–7.7)
Neutrophils Relative %: 45 %
Platelets: 179 10*3/uL (ref 150–400)
RBC: 3.52 MIL/uL — ABNORMAL LOW (ref 4.22–5.81)
RDW: 14.2 % (ref 11.5–15.5)
WBC: 3.2 10*3/uL — ABNORMAL LOW (ref 4.0–10.5)
nRBC: 0 % (ref 0.0–0.2)

## 2020-12-29 LAB — COMPREHENSIVE METABOLIC PANEL
ALT: 14 U/L (ref 0–44)
AST: 24 U/L (ref 15–41)
Albumin: 3.1 g/dL — ABNORMAL LOW (ref 3.5–5.0)
Alkaline Phosphatase: 39 U/L (ref 38–126)
Anion gap: 3 — ABNORMAL LOW (ref 5–15)
BUN: 17 mg/dL (ref 8–23)
CO2: 24 mmol/L (ref 22–32)
Calcium: 8.7 mg/dL — ABNORMAL LOW (ref 8.9–10.3)
Chloride: 104 mmol/L (ref 98–111)
Creatinine, Ser: 1.32 mg/dL — ABNORMAL HIGH (ref 0.61–1.24)
GFR, Estimated: 52 mL/min — ABNORMAL LOW (ref >60)
Glucose, Bld: 75 mg/dL (ref 70–99)
Potassium: 3.9 mmol/L (ref 3.5–5.1)
Sodium: 131 mmol/L — ABNORMAL LOW (ref 135–145)
Total Bilirubin: 0.6 mg/dL (ref 0.3–1.2)
Total Protein: 9.7 g/dL — ABNORMAL HIGH (ref 6.5–8.1)

## 2020-12-30 LAB — KAPPA/LAMBDA LIGHT CHAINS
Kappa free light chain: 217.5 mg/L — ABNORMAL HIGH (ref 3.3–19.4)
Kappa, lambda light chain ratio: 13.85 — ABNORMAL HIGH (ref 0.26–1.65)
Lambda free light chains: 15.7 mg/L (ref 5.7–26.3)

## 2021-01-01 LAB — MULTIPLE MYELOMA PANEL, SERUM
Albumin SerPl Elph-Mcnc: 3.9 g/dL (ref 2.9–4.4)
Albumin/Glob SerPl: 0.7 (ref 0.7–1.7)
Alpha 1: 0.2 g/dL (ref 0.0–0.4)
Alpha2 Glob SerPl Elph-Mcnc: 0.5 g/dL (ref 0.4–1.0)
B-Globulin SerPl Elph-Mcnc: 0.8 g/dL (ref 0.7–1.3)
Gamma Glob SerPl Elph-Mcnc: 4.4 g/dL — ABNORMAL HIGH (ref 0.4–1.8)
Globulin, Total: 6 g/dL — ABNORMAL HIGH (ref 2.2–3.9)
IgA: <5 mg/dL — ABNORMAL LOW (ref 61–437)
IgG (Immunoglobin G), Serum: 6118 mg/dL — ABNORMAL HIGH (ref 603–1613)
IgM (Immunoglobulin M), Srm: 8 mg/dL — ABNORMAL LOW (ref 15–143)
M Protein SerPl Elph-Mcnc: 4 g/dL — ABNORMAL HIGH
Total Protein ELP: 9.9 g/dL — ABNORMAL HIGH (ref 6.0–8.5)

## 2021-01-05 ENCOUNTER — Inpatient Hospital Stay (HOSPITAL_COMMUNITY): Payer: Medicare FFS | Admitting: Hematology

## 2021-01-05 ENCOUNTER — Other Ambulatory Visit: Payer: Self-pay

## 2021-01-05 VITALS — BP 119/72 | HR 75 | Temp 97.7°F | Resp 17 | Wt 178.6 lb

## 2021-01-05 DIAGNOSIS — C9000 Multiple myeloma not having achieved remission: Secondary | ICD-10-CM

## 2021-01-05 DIAGNOSIS — D472 Monoclonal gammopathy: Secondary | ICD-10-CM

## 2021-01-05 NOTE — Progress Notes (Signed)
[place], [place] [address]   CLINIC:  Medical Oncology/Hematology  PCP:  Neale Burly, MD [place] / [place] [address]  [phone]  REASON FOR VISIT:  Follow-up for smoldering myeloma  PRIOR THERAPY: None  CURRENT THERAPY: Surveillance  INTERVAL HISTORY:  [name], a 87 y.o. 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 Magic was last seen by Dr. Nicholas Lose on 10/01/2020.  Today he reports feeling well. He denies having any new pains or numbness or tingling.    REVIEW OF SYSTEMS:  Review of Systems  Constitutional: Positive for fatigue (50%). Negative for appetite change.  Respiratory: Positive for shortness of breath.   Musculoskeletal: Negative for arthralgias and myalgias.  Neurological: Negative for numbness.  All other systems reviewed and are negative.   PAST MEDICAL/SURGICAL HISTORY:  Past Medical History:  Diagnosis Date  . Anemia   . Glaucoma   . Hypertension    No past surgical history on file.  SOCIAL HISTORY:  Social History   Socioeconomic History  . Marital status: Widowed    Spouse name: Not on file  . Number of children: Not on file  . Years of education: Not on file  . Highest education level: Not on file  Occupational History  . Not on file  Tobacco Use  . Smoking status: Never Smoker  . Smokeless tobacco: Never Used  Substance and Sexual Activity  . Alcohol use: No  . Drug use: No  . Sexual activity: Not Currently  Other Topics Concern  . Not on file  Social History Narrative  . Not on file   Social Determinants of Health   Financial Resource Strain: Low Risk   . Difficulty of Paying Living Expenses: Not hard at all  Food Insecurity: No Food Insecurity  . Worried About Charity fundraiser in the Last Year: Never true  . Ran Out of Food in the Last Year: Never true  Transportation Needs: No Transportation Needs  . Lack of Transportation  (Medical): No  . Lack of Transportation (Non-Medical): No  Physical Activity: Inactive  . Days of Exercise per Week: 0 days  . Minutes of Exercise per Session: 0 min  Stress: Stress Concern Present  . Feeling of Stress : To some extent  Social Connections: Moderately Isolated  . Frequency of Communication with Friends and Family: More than three times a week  . Frequency of Social Gatherings with Friends and Family: Never  . Attends Religious Services: More than 4 times per year  . Active Member of Clubs or Organizations: No  . Attends Archivist Meetings: Never  . Marital Status: Widowed  Intimate Partner Violence: Not At Risk  . Fear of Current or Ex-Partner: No  . Emotionally Abused: No  . Physically Abused: No  . Sexually Abused: No    FAMILY HISTORY:  Family History  Problem Relation Age of Onset  . Hypertension Mother   . Heart attack Mother   . Heart attack Father   . Stroke Sister   . Alcoholism Brother   . Cirrhosis Brother     CURRENT MEDICATIONS:  Current Outpatient Medications  Medication Sig Dispense Refill  . albuterol (PROVENTIL HFA;VENTOLIN HFA) 108 (90 Base) MCG/ACT inhaler Inhale into the lungs.     . Cholecalciferol (VITAMIN D3) 2000 units TABS Take 2,000 Units by mouth daily.     . cyanocobalamin 500 MCG tablet Take 500 mcg  by mouth daily.    . dorzolamide (TRUSOPT) 2 % ophthalmic solution INSTILL ONE DROP IN EACH EYE TWICE DAILY  2  . doxazosin (CARDURA) 8 MG tablet Take 8 mg by mouth daily.     . Ferrous Gluconate (IRON 27 PO) Take 1 tablet every morning by mouth.    . Glucosamine Sulfate 1000 MG CAPS Take by mouth. Takes twice a day    . hydrochlorothiazide (HYDRODIURIL) 50 MG tablet Take 50 mg by mouth daily.     Marland Kitchen latanoprost (XALATAN) 0.005 % ophthalmic solution Place 1 drop into both eyes at bedtime.    Marland Kitchen loratadine (CLARITIN) 10 MG tablet Take 10 mg by mouth daily.     Marland Kitchen warfarin (COUMADIN) 5 MG tablet TAKE 1 TABLET BY MOUTH EVERY  OTHER DAY ALTERNATING WITH ONE & 1/2 TABLETS EVERY OTHER DAY     No current facility-administered medications for this visit.    ALLERGIES:  No Known Allergies  PHYSICAL EXAM:  Performance status (ECOG): 1 - Symptomatic but completely ambulatory  Vitals:   01/05/21 1432  BP: 119/72  Pulse: 75  Resp: 17  Temp: 97.7 F (36.5 C)  SpO2: 99%   Wt Readings from Last 3 Encounters:  01/05/21 178 lb 9.6 oz (81 kg)  10/01/20 180 lb 12.8 oz (82 kg)  05/19/20 181 lb 9.6 oz (82.4 kg)   Physical Exam Vitals reviewed.  Constitutional:      Appearance: Normal appearance.  Cardiovascular:     Rate and Rhythm: Normal rate and regular rhythm.     Pulses: Normal pulses.     Heart sounds: Normal heart sounds.  Pulmonary:     Effort: Pulmonary effort is normal.     Breath sounds: Normal breath sounds.  Abdominal:     Palpations: Abdomen is soft. There is no hepatomegaly, splenomegaly or mass.     Tenderness: There is no abdominal tenderness.  Musculoskeletal:     Right lower leg: Edema (trace) present.     Left lower leg: Edema (trace) present.  Neurological:     General: No focal deficit present.     Mental Status: He is alert and oriented to person, place, and time.  Psychiatric:        Mood and Affect: Mood normal.        Behavior: Behavior normal.     LABORATORY DATA:  I have reviewed the labs as listed.  CBC Latest Ref Rng & Units 12/29/2020 10/01/2020 05/12/2020  WBC 4.0 - 10.5 K/uL 3.2(L) 3.3(L) 3.4(L)  Hemoglobin 13.0 - 17.0 g/dL 11.8(L) 11.2(L) 10.6(L)  Hematocrit 39.0 - 52.0 % 37.0(L) 35.6(L) 33.1(L)  Platelets 150 - 400 K/uL 179 160 168   CMP Latest Ref Rng & Units 12/29/2020 09/24/2020 05/12/2020  Glucose 70 - 99 mg/dL 75 83 84  BUN 8 - 23 mg/dL 17 20 17   Creatinine 0.61 - 1.24 mg/dL 1.32(H) 1.31(H) 1.35(H)  Sodium 135 - 145 mmol/L 131(L) 126(L) 132(L)  Potassium 3.5 - 5.1 mmol/L 3.9 3.9 3.9  Chloride 98 - 111 mmol/L 104 101 105  CO2 22 - 32 mmol/L 24 22 23   Calcium  8.9 - 10.3 mg/dL 8.7(L) 8.7(L) 8.5(L)  Total Protein 6.5 - 8.1 g/dL 9.7(H) 9.7(H) 10.1(H)  Total Bilirubin 0.3 - 1.2 mg/dL 0.6 0.8 0.5  Alkaline Phos 38 - 126 U/L 39 41 42  AST 15 - 41 U/L 24 29 23   ALT 0 - 44 U/L 14 14 11       Component Value Date/Time  RBC 3.52 (L) 12/29/2020 1153   MCV 105.1 (H) 12/29/2020 1153   MCH 33.5 12/29/2020 1153   MCHC 31.9 12/29/2020 1153   RDW 14.2 12/29/2020 1153   LYMPHSABS 1.1 12/29/2020 1153   MONOABS 0.6 12/29/2020 1153   EOSABS 0.1 12/29/2020 1153   BASOSABS 0.0 12/29/2020 1153   Lab Results  Component Value Date   TOTALPROTELP 9.9 (H) 12/29/2020   ALBUMINELP 3.8 09/24/2020   A1GS 0.2 09/24/2020   A2GS 0.5 09/24/2020   BETS 1.2 09/24/2020   GAMS 4.8 (H) 09/24/2020   MSPIKE 4.4 (H) 09/24/2020   SPEI Comment 09/24/2020    Lab Results  Component Value Date   KPAFRELGTCHN 217.5 (H) 12/29/2020   LAMBDASER 15.7 12/29/2020   KAPLAMBRATIO 13.85 (H) 12/29/2020   Lab Results  Component Value Date   IGGSERUM 6,118 (H) 12/29/2020   IGGSERUM 6,183 (H) 11/08/2019   IGGSERUM 5,629 (H) 08/09/2019   IGA <5 (L) 12/29/2020   IGA <5 (L) 11/08/2019   IGA <5 (L) 08/09/2019   IGMSERUM 8 (L) 12/29/2020   IGMSERUM 10 (L) 11/08/2019   IGMSERUM 14 (L) 08/09/2019     DIAGNOSTIC IMAGING:  I have independently reviewed the scans and discussed with the patient. No results found.   ASSESSMENT:  1. Smoldering myeloma: -BMBX on 01/07/2016 at [facility] showed trilineage hematopoiesis, plasma cells 13%, FISH panel showed an additional IGH/14 q. signal. Gain of 9 chromosome and CCN D1/11 q. Chromosome analysis 48, XY. -PET scan on 12/24/2019 showed no lytic lesions or suspicious lesions for myeloma.  2. 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 -Doppler on 08/09/2018 showed acute thrombus in the left popliteal and femoral vein. Coumadin was changed to Xarelto, which was later switched to  Eliquis.  3. Mediastinal adenopathy: -PET scan on 12/24/2019 did not show any mediastinal adenopathy. However it was seen on PET scan in 2018.   PLAN:  1.  Smoldering myeloma: -He does not report any new onset bone pains.  He continues to be active. -Reviewed labs from 12/29/2020.  Creatinine is 1.32 and stable.  Calcium is 8.7.  M spike is stable at 4 g.  Hemoglobin was 11.8.  Kappa light chains to 17, ratio of 13.85 and stable. -We will change his follow-up visits to every 6 months. -We will plan skeletal survey prior to next visit in 6 months along with myeloma labs.  2.  Iron deficiency state: -Continue iron tablet daily. -Hemoglobin improved 11.8.  3.  Recurrent leg DVT: -Continue Eliquis.  Orders placed this encounter:  Orders Placed This Encounter  Procedures  . DG Bone Survey Met  . CBC with Differential/Platelet  . Comprehensive metabolic panel  . Protein electrophoresis, serum  . Kappa/lambda light chains     Derek Jack, MD [place] [phone]   I, Milinda Antis, am acting as a scribe for Dr. Sanda Linger.  I, Derek Jack MD, have reviewed the above documentation for accuracy and completeness, and I agree with the above.

## 2021-01-05 NOTE — Patient Instructions (Signed)
[place] at [facility] Discharge Instructions  You were seen today by Dr. Delton Coombes. He went over your recent results. You will be scheduled to have a skeletal survey prior to next visit. Dr. Delton Coombes will see you back in 6 months for labs and follow up.   Thank you for choosing [place] at [facility] to provide your oncology and hematology care.  To afford each patient quality time with our provider, please arrive at least 15 minutes before your scheduled appointment time.   If you have a lab appointment with the [facility] please come in thru the Main Entrance and check in at the main information desk  You need to re-schedule your appointment should you arrive 10 or more minutes late.  We strive to give you quality time with our providers, and arriving late affects you and other patients whose appointments are after yours.  Also, if you no show three or more times for appointments you may be dismissed from the clinic at the providers discretion.     Again, thank you for choosing [facility].  Our hope is that these requests will decrease the amount of time that you wait before being seen by our physicians.       _____________________________________________________________  Should you have questions after your visit to [facility], please contact our office at [phone] between the hours of 8:00 a.m. and 4:30 p.m.  Voicemails left after 4:00 p.m. will not be returned until the following business day.  For prescription refill requests, have your pharmacy contact our office and allow 72 hours.    Cancer Center Support Programs:   > Cancer Support Group  2nd Tuesday of the month 1pm-2pm, Journey Room

## 2021-07-15 ENCOUNTER — Ambulatory Visit (HOSPITAL_COMMUNITY)
Admission: RE | Admit: 2021-07-15 | Discharge: 2021-07-15 | Disposition: A | Discharge status: Home / Self Care | Payer: Medicare FFS | Source: Ambulatory Visit | Attending: Hematology | Admitting: Hematology

## 2021-07-15 ENCOUNTER — Other Ambulatory Visit: Payer: Self-pay

## 2021-07-15 ENCOUNTER — Inpatient Hospital Stay (HOSPITAL_COMMUNITY): Payer: Medicare FFS | Attending: Hematology

## 2021-07-15 DIAGNOSIS — C9000 Multiple myeloma not having achieved remission: Secondary | ICD-10-CM | POA: Insufficient documentation

## 2021-07-15 DIAGNOSIS — D472 Monoclonal gammopathy: Secondary | ICD-10-CM

## 2021-07-15 DIAGNOSIS — Z79899 Other long term (current) drug therapy: Secondary | ICD-10-CM | POA: Diagnosis not present

## 2021-07-15 DIAGNOSIS — Z7901 Long term (current) use of anticoagulants: Secondary | ICD-10-CM | POA: Insufficient documentation

## 2021-07-15 DIAGNOSIS — Z86711 Personal history of pulmonary embolism: Secondary | ICD-10-CM | POA: Insufficient documentation

## 2021-07-15 LAB — CBC WITH DIFFERENTIAL/PLATELET
Abs Immature Granulocytes: 0.01 10*3/uL (ref 0.00–0.07)
Basophils Absolute: 0 10*3/uL (ref 0.0–0.1)
Basophils Relative: 0 %
Eosinophils Absolute: 0 10*3/uL (ref 0.0–0.5)
Eosinophils Relative: 1 %
HCT: 34.3 % — ABNORMAL LOW (ref 39.0–52.0)
Hemoglobin: 10.9 g/dL — ABNORMAL LOW (ref 13.0–17.0)
Immature Granulocytes: 0 %
Lymphocytes Relative: 22 %
Lymphs Abs: 0.9 10*3/uL (ref 0.7–4.0)
MCH: 33.4 pg (ref 26.0–34.0)
MCHC: 31.8 g/dL (ref 30.0–36.0)
MCV: 105.2 fL — ABNORMAL HIGH (ref 80.0–100.0)
Monocytes Absolute: 0.5 10*3/uL (ref 0.1–1.0)
Monocytes Relative: 13 %
Neutro Abs: 2.6 10*3/uL (ref 1.7–7.7)
Neutrophils Relative %: 64 %
Platelets: 153 10*3/uL (ref 150–400)
RBC: 3.26 MIL/uL — ABNORMAL LOW (ref 4.22–5.81)
RDW: 14.5 % (ref 11.5–15.5)
WBC: 4.1 10*3/uL (ref 4.0–10.5)
nRBC: 0 % (ref 0.0–0.2)

## 2021-07-15 LAB — COMPREHENSIVE METABOLIC PANEL
ALT: 14 U/L (ref 0–44)
AST: 24 U/L (ref 15–41)
Albumin: 3.2 g/dL — ABNORMAL LOW (ref 3.5–5.0)
Alkaline Phosphatase: 42 U/L (ref 38–126)
Anion gap: 3 — ABNORMAL LOW (ref 5–15)
BUN: 25 mg/dL — ABNORMAL HIGH (ref 8–23)
CO2: 24 mmol/L (ref 22–32)
Calcium: 8.7 mg/dL — ABNORMAL LOW (ref 8.9–10.3)
Chloride: 100 mmol/L (ref 98–111)
Creatinine, Ser: 1.41 mg/dL — ABNORMAL HIGH (ref 0.61–1.24)
GFR, Estimated: 48 mL/min — ABNORMAL LOW (ref >60)
Glucose, Bld: 82 mg/dL (ref 70–99)
Potassium: 3.7 mmol/L (ref 3.5–5.1)
Sodium: 127 mmol/L — ABNORMAL LOW (ref 135–145)
Total Bilirubin: 0.5 mg/dL (ref 0.3–1.2)
Total Protein: 10 g/dL — ABNORMAL HIGH (ref 6.5–8.1)

## 2021-07-16 LAB — PROTEIN ELECTROPHORESIS, SERUM
A/G Ratio: 0.6 — ABNORMAL LOW (ref 0.7–1.7)
Albumin ELP: 3.7 g/dL (ref 2.9–4.4)
Alpha-1-Globulin: 0.2 g/dL (ref 0.0–0.4)
Alpha-2-Globulin: 0.6 g/dL (ref 0.4–1.0)
Beta Globulin: 0.8 g/dL (ref 0.7–1.3)
Gamma Globulin: 4.6 g/dL — ABNORMAL HIGH (ref 0.4–1.8)
Globulin, Total: 6.2 g/dL — ABNORMAL HIGH (ref 2.2–3.9)
M-Spike, %: 4.3 g/dL — ABNORMAL HIGH
Total Protein ELP: 9.9 g/dL — ABNORMAL HIGH (ref 6.0–8.5)

## 2021-07-16 LAB — KAPPA/LAMBDA LIGHT CHAINS
Kappa free light chain: 270 mg/L — ABNORMAL HIGH (ref 3.3–19.4)
Kappa, lambda light chain ratio: 14.59 — ABNORMAL HIGH (ref 0.26–1.65)
Lambda free light chains: 18.5 mg/L (ref 5.7–26.3)

## 2021-07-21 NOTE — Progress Notes (Signed)
[place], [place] [address]   CLINIC:  Medical Oncology/Hematology  PCP:  Neale Burly, MD [place] / [place] [address] [phone]   REASON FOR VISIT:  Follow-up for smoldering myeloma  PRIOR THERAPY: none  NGS Results: not done  CURRENT THERAPY: surveillance  BRIEF ONCOLOGIC HISTORY:  Oncology History   No history exists.    CANCER STAGING: Cancer Staging No matching staging information was found for the patient.  INTERVAL HISTORY:  Dale Suarez, a 87 y.o. male, returns for routine follow-up of his smoldering myeloma. Dale Suarez was last seen on 01/05/2021.   Today he reports feeling well. He denies any new pains or infections in the last 6 months. He stopped Eliquis and is now taking Warfarin. He takes 1 iron tablet daily.   REVIEW OF SYSTEMS:  Review of Systems  Constitutional:  Negative for appetite change (75%) and fatigue (60%).  Musculoskeletal:  Negative for arthralgias and myalgias.  Psychiatric/Behavioral:  The patient is nervous/anxious.   All other systems reviewed and are negative.  PAST MEDICAL/SURGICAL HISTORY:  Past Medical History:  Diagnosis Date   Anemia    Glaucoma    Hypertension    No past surgical history on file.  SOCIAL HISTORY:  Social History   Socioeconomic History   Marital status: Widowed    Spouse name: Not on file   Number of children: Not on file   Years of education: Not on file   Highest education level: Not on file  Occupational History   Not on file  Tobacco Use   Smoking status: Never   Smokeless tobacco: Never  Substance and Sexual Activity   Alcohol use: No   Drug use: No   Sexual activity: Not Currently  Other Topics Concern   Not on file  Social History Narrative   Not on file   Social Determinants of Health   Financial Resource Strain: Low Risk    Difficulty of Paying Living Expenses: Not hard at all  Food Insecurity: No Food Insecurity    Worried About Running Out of Food in the Last Year: Never true   [place] in the Last Year: Never true  Transportation Needs: No Transportation Needs   Lack of Transportation (Medical): No   Lack of Transportation (Non-Medical): No  Physical Activity: Inactive   Days of Exercise per Week: 0 days   Minutes of Exercise per Session: 0 min  Stress: Stress Concern Present   Feeling of Stress : To some extent  Social Connections: Moderately Isolated   Frequency of Communication with Friends and Family: More than three times a week   Frequency of Social Gatherings with Friends and Family: Never   Attends Religious Services: More than 4 times per year   Active Member of [employer] or Organizations: No   Attends Archivist Meetings: Never   Marital Status: Widowed  Human resources officer Violence: Not At Risk   Fear of Current or Ex-Partner: No   Emotionally Abused: No   Physically Abused: No   Sexually Abused: No    FAMILY HISTORY:  Family History  Problem Relation Age of Onset   Hypertension Mother    Heart attack Mother    Heart attack Father    Stroke Sister    Alcoholism Brother    Cirrhosis Brother     CURRENT MEDICATIONS:  Current Outpatient Medications  Medication Sig Dispense Refill   albuterol (PROVENTIL HFA;VENTOLIN  HFA) 108 (90 Base) MCG/ACT inhaler Inhale into the lungs.      Cholecalciferol (VITAMIN D3) 2000 units TABS Take 2,000 Units by mouth daily.      cyanocobalamin 500 MCG tablet Take 500 mcg by mouth daily.     dorzolamide (TRUSOPT) 2 % ophthalmic solution INSTILL ONE DROP IN EACH EYE TWICE DAILY  2   doxazosin (CARDURA) 8 MG tablet Take 8 mg by mouth daily.      Ferrous Gluconate (IRON 27 PO) Take 1 tablet every morning by mouth.     Glucosamine Sulfate 1000 MG CAPS Take by mouth. Takes twice a day     hydrochlorothiazide (HYDRODIURIL) 50 MG tablet Take 50 mg by mouth daily.      latanoprost (XALATAN) 0.005 % ophthalmic solution Place 1 drop into both  eyes at bedtime.     loratadine (CLARITIN) 10 MG tablet Take 10 mg by mouth daily.      warfarin (COUMADIN) 5 MG tablet TAKE 1 TABLET BY MOUTH EVERY OTHER DAY ALTERNATING WITH ONE & 1/2 TABLETS EVERY OTHER DAY     No current facility-administered medications for this visit.    ALLERGIES:  No Known Allergies  PHYSICAL EXAM:  Performance status (ECOG): 1 - Symptomatic but completely ambulatory  There were no vitals filed for this visit. Wt Readings from Last 3 Encounters:  01/05/21 178 lb 9.6 oz (81 kg)  10/01/20 180 lb 12.8 oz (82 kg)  05/19/20 181 lb 9.6 oz (82.4 kg)   Physical Exam Vitals reviewed.  Constitutional:      Appearance: Normal appearance.  Cardiovascular:     Rate and Rhythm: Normal rate and regular rhythm.     Pulses: Normal pulses.     Heart sounds: Normal heart sounds.  Pulmonary:     Effort: Pulmonary effort is normal.     Breath sounds: Normal breath sounds.  Neurological:     General: No focal deficit present.     Mental Status: He is alert and oriented to person, place, and time.  Psychiatric:        Mood and Affect: Mood normal.        Behavior: Behavior normal.     LABORATORY DATA:  I have reviewed the labs as listed.  CBC Latest Ref Rng & Units 07/15/2021 12/29/2020 10/01/2020  WBC 4.0 - 10.5 K/uL 4.1 3.2(L) 3.3(L)  Hemoglobin 13.0 - 17.0 g/dL 10.9(L) 11.8(L) 11.2(L)  Hematocrit 39.0 - 52.0 % 34.3(L) 37.0(L) 35.6(L)  Platelets 150 - 400 K/uL 153 179 160   CMP Latest Ref Rng & Units 07/15/2021 12/29/2020 09/24/2020  Glucose 70 - 99 mg/dL 82 75 83  BUN 8 - 23 mg/dL 25(H) 17 20  Creatinine 0.61 - 1.24 mg/dL 1.41(H) 1.32(H) 1.31(H)  Sodium 135 - 145 mmol/L 127(L) 131(L) 126(L)  Potassium 3.5 - 5.1 mmol/L 3.7 3.9 3.9  Chloride 98 - 111 mmol/L 100 104 101  CO2 22 - 32 mmol/L _0 Calcium 8.9 - 10.3 mg/dL 8.7(L) 8.7(L) 8.7(L)  Total Protein 6.5 - 8.1 g/dL 10.0(H) 9.7(H) 9.7(H)  Total Bilirubin 0.3 - 1.2 mg/dL 0.5 0.6 0.8  Alkaline Phos 38 - 126  U/L 42 39 41  AST 15 - 41 U/L _1 ALT 0 - 44 U/L _2 DIAGNOSTIC IMAGING:  I have independently reviewed the scans and discussed with the patient. DG Bone Survey Met  Result Date: 07/16/2021 CLINICAL DATA:  Multiple myeloma. EXAM: METASTATIC BONE SURVEY COMPARISON:  Radiograph dated 11/08/2019. FINDINGS: Tiny lucent lesion in the proximal left humeral diaphysis similar to prior radiograph. No other suspicious lesions identified. The bones are osteopenic. Severe degenerative changes of the spine. IMPRESSION: Unchanged tiny lucent lesion in the proximal left humeral diaphysis. Electronically Signed   By: Anner Crete M.D.   On: 07/16/2021 21:11     ASSESSMENT:  1.  Smoldering myeloma: -BMBX on 01/07/2016 at [facility] showed trilineage hematopoiesis, plasma cells 13%, FISH panel showed an additional IGH/14 q. signal.  Gain of 9 chromosome and CCN D1/11 q.  Chromosome analysis 48, XY. -PET scan on 12/24/2019 showed no lytic lesions or suspicious lesions for myeloma.   2.  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 -Doppler on 08/09/2018 showed acute thrombus in the left popliteal and femoral vein.  Coumadin was changed to Xarelto, which was later switched to Eliquis.   3.  Mediastinal adenopathy: -PET scan on 12/24/2019 did not show any mediastinal adenopathy.  However it was seen on PET scan in 2018.   PLAN:  1.  Smoldering myeloma: -He does not have any new onset bone pains. - He is continuing to be active.  Denies any infections in the past 6 months. - Reviewed labs from 07/15/2021 which showed creatinine elevated at 1.41 and stable.  Calcium is 8.7 with albumin 3.2.  M spike is stable at 4.3 g.  Free light chain ratio is also stable around 14.5 with kappa light chains to 70. - Reviewed skeletal survey from 07/15/2021 which showed unchanged tiny lucent lesion in the proximal left humeral diaphysis. - He does not have any  "crab" features at this time.  We will continue to monitor at 86-monthintervals.   2.  Iron deficiency state: -Continue iron tablet daily.  Hemoglobin is stable at 10.9.   3.  Recurrent leg DVT: -He is now taking warfarin as his insurance would not cover Eliquis.   Orders placed this encounter:  No orders of the defined types were placed in this encounter.    SDerek Jack MD [phone]  I, KThana Ates am acting as a scribe for Dr. SDerek Jack  I, SDerek JackMD, have reviewed the above documentation for accuracy and completeness, and I agree with the above.

## 2021-07-22 ENCOUNTER — Inpatient Hospital Stay (HOSPITAL_COMMUNITY): Payer: Medicare FFS | Admitting: Hematology

## 2021-07-22 ENCOUNTER — Other Ambulatory Visit: Payer: Self-pay

## 2021-07-22 VITALS — BP 133/64 | HR 78 | Temp 98.1°F | Resp 16 | Wt 175.0 lb

## 2021-07-22 DIAGNOSIS — C9000 Multiple myeloma not having achieved remission: Secondary | ICD-10-CM | POA: Diagnosis not present

## 2021-07-22 DIAGNOSIS — D472 Monoclonal gammopathy: Secondary | ICD-10-CM

## 2021-07-22 NOTE — Patient Instructions (Signed)
[facility] at [facility] Discharge Instructions  You were seen today by Dr. Katragadda. He went over your recent results. Dr. Katragadda will see you back in 6 months for labs and follow up.   Thank you for choosing [facility] at [facility] to provide your oncology and hematology care.  To afford each patient quality time with our provider, please arrive at least 15 minutes before your scheduled appointment time.   If you have a lab appointment with the Cancer Center please come in thru the Main Entrance and check in at the main information desk  You need to re-schedule your appointment should you arrive 10 or more minutes late.  We strive to give you quality time with our providers, and arriving late affects you and other patients whose appointments are after yours.  Also, if you no show three or more times for appointments you may be dismissed from the clinic at the providers discretion.     Again, thank you for choosing [facility].  Our hope is that these requests will decrease the amount of time that you wait before being seen by our physicians.       _____________________________________________________________  Should you have questions after your visit to [facility], please contact our office at [phone] between the hours of 8:00 a.m. and 4:30 p.m.  Voicemails left after 4:00 p.m. will not be returned until the following business day.  For prescription refill requests, have your pharmacy contact our office and allow 72 hours.    Cancer Center Support Programs:   > Cancer Support Group  2nd Tuesday of the month 1pm-2pm, Journey Room    

## 2022-01-19 ENCOUNTER — Inpatient Hospital Stay (HOSPITAL_COMMUNITY): Payer: Medicare FFS | Attending: Hematology

## 2022-01-19 DIAGNOSIS — C9000 Multiple myeloma not having achieved remission: Secondary | ICD-10-CM | POA: Insufficient documentation

## 2022-01-19 DIAGNOSIS — D472 Monoclonal gammopathy: Secondary | ICD-10-CM

## 2022-01-19 LAB — COMPREHENSIVE METABOLIC PANEL
ALT: 12 U/L (ref 0–44)
AST: 25 U/L (ref 15–41)
Albumin: 3.3 g/dL — ABNORMAL LOW (ref 3.5–5.0)
Alkaline Phosphatase: 40 U/L (ref 38–126)
Anion gap: 3 — ABNORMAL LOW (ref 5–15)
BUN: 22 mg/dL (ref 8–23)
CO2: 24 mmol/L (ref 22–32)
Calcium: 8.8 mg/dL — ABNORMAL LOW (ref 8.9–10.3)
Chloride: 100 mmol/L (ref 98–111)
Creatinine, Ser: 1.49 mg/dL — ABNORMAL HIGH (ref 0.61–1.24)
GFR, Estimated: 45 mL/min — ABNORMAL LOW (ref >60)
Glucose, Bld: 84 mg/dL (ref 70–99)
Potassium: 3.8 mmol/L (ref 3.5–5.1)
Sodium: 127 mmol/L — ABNORMAL LOW (ref 135–145)
Total Bilirubin: 0.7 mg/dL (ref 0.3–1.2)
Total Protein: 10.4 g/dL — ABNORMAL HIGH (ref 6.5–8.1)

## 2022-01-19 LAB — CBC WITH DIFFERENTIAL/PLATELET
Abs Immature Granulocytes: 0.01 10*3/uL (ref 0.00–0.07)
Basophils Absolute: 0 10*3/uL (ref 0.0–0.1)
Basophils Relative: 0 %
Eosinophils Absolute: 0.1 10*3/uL (ref 0.0–0.5)
Eosinophils Relative: 2 %
HCT: 35.3 % — ABNORMAL LOW (ref 39.0–52.0)
Hemoglobin: 11.3 g/dL — ABNORMAL LOW (ref 13.0–17.0)
Immature Granulocytes: 0 %
Lymphocytes Relative: 29 %
Lymphs Abs: 1 10*3/uL (ref 0.7–4.0)
MCH: 33.2 pg (ref 26.0–34.0)
MCHC: 32 g/dL (ref 30.0–36.0)
MCV: 103.8 fL — ABNORMAL HIGH (ref 80.0–100.0)
Monocytes Absolute: 0.5 10*3/uL (ref 0.1–1.0)
Monocytes Relative: 14 %
Neutro Abs: 1.8 10*3/uL (ref 1.7–7.7)
Neutrophils Relative %: 55 %
Platelets: 154 10*3/uL (ref 150–400)
RBC: 3.4 MIL/uL — ABNORMAL LOW (ref 4.22–5.81)
RDW: 14.4 % (ref 11.5–15.5)
WBC: 3.4 10*3/uL — ABNORMAL LOW (ref 4.0–10.5)
nRBC: 0 % (ref 0.0–0.2)

## 2022-01-20 LAB — PROTEIN ELECTROPHORESIS, SERUM
A/G Ratio: 0.6 — ABNORMAL LOW (ref 0.7–1.7)
Albumin ELP: 3.7 g/dL (ref 2.9–4.4)
Alpha-1-Globulin: 0.2 g/dL (ref 0.0–0.4)
Alpha-2-Globulin: 0.6 g/dL (ref 0.4–1.0)
Beta Globulin: 0.8 g/dL (ref 0.7–1.3)
Gamma Globulin: 4.7 g/dL — ABNORMAL HIGH (ref 0.4–1.8)
Globulin, Total: 6.4 g/dL — ABNORMAL HIGH (ref 2.2–3.9)
M-Spike, %: 4.2 g/dL — ABNORMAL HIGH
Total Protein ELP: 10.1 g/dL — ABNORMAL HIGH (ref 6.0–8.5)

## 2022-01-20 LAB — KAPPA/LAMBDA LIGHT CHAINS
Kappa free light chain: 299.2 mg/L — ABNORMAL HIGH (ref 3.3–19.4)
Kappa, lambda light chain ratio: 17.92 — ABNORMAL HIGH (ref 0.26–1.65)
Lambda free light chains: 16.7 mg/L (ref 5.7–26.3)

## 2022-01-26 ENCOUNTER — Inpatient Hospital Stay (HOSPITAL_COMMUNITY): Payer: Medicare FFS | Attending: Hematology | Admitting: Hematology

## 2022-01-26 VITALS — BP 129/78 | HR 89 | Temp 99.0°F | Resp 18 | Ht 70.08 in | Wt 179.0 lb

## 2022-01-26 DIAGNOSIS — D472 Monoclonal gammopathy: Secondary | ICD-10-CM | POA: Diagnosis present

## 2022-01-26 DIAGNOSIS — Z86711 Personal history of pulmonary embolism: Secondary | ICD-10-CM | POA: Diagnosis not present

## 2022-01-26 DIAGNOSIS — Z79899 Other long term (current) drug therapy: Secondary | ICD-10-CM | POA: Insufficient documentation

## 2022-01-26 DIAGNOSIS — Z7901 Long term (current) use of anticoagulants: Secondary | ICD-10-CM | POA: Insufficient documentation

## 2022-01-26 DIAGNOSIS — E611 Iron deficiency: Secondary | ICD-10-CM | POA: Diagnosis not present

## 2022-01-26 DIAGNOSIS — Z86718 Personal history of other venous thrombosis and embolism: Secondary | ICD-10-CM | POA: Insufficient documentation

## 2022-01-26 NOTE — Progress Notes (Signed)
? ?[facility] ?[address] ?[place], [place] [address] ? ? ?CLINIC:  ?Medical Oncology/Hematology ? ?PCP:  ?Neale Burly, MD ?[facility] / [place] [address]  ?[phone] ? ?REASON FOR VISIT:  ?Follow-up for smoldering myeloma ? ?PRIOR THERAPY: none ? ?CURRENT THERAPY: surveillance ? ?INTERVAL HISTORY:  ?[name], a 88 y.o. 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 [place] was last seen on 07/22/2021. ? ?Today he reports feeling good. He denies new pains and infections within the past 6 months. He reports occasional dizziness upon standing.  ? ?REVIEW OF SYSTEMS:  ?Review of Systems  ?Constitutional:  Negative for appetite change and fatigue.  ?Neurological:  Positive for dizziness.  ?Psychiatric/Behavioral:  Positive for depression. The patient is nervous/anxious.   ?All other systems reviewed and are negative. ? ?PAST MEDICAL/SURGICAL HISTORY:  ?Past Medical History:  ?Diagnosis Date  ? Anemia   ? Glaucoma   ? Hypertension   ? ?No past surgical history on file. ? ?SOCIAL HISTORY:  ?Social History  ? ?Socioeconomic History  ? Marital status: Widowed  ?  Spouse name: Not on file  ? Number of children: Not on file  ? Years of education: Not on file  ? Highest education level: Not on file  ?Occupational History  ? Not on file  ?Tobacco Use  ? Smoking status: Never  ? Smokeless tobacco: Never  ?Substance and Sexual Activity  ? Alcohol use: No  ? Drug use: No  ? Sexual activity: Not Currently  ?Other Topics Concern  ? Not on file  ?Social History Narrative  ? Not on file  ? ?Social Determinants of Health  ? ?Financial Resource Strain: Not on file  ?Food Insecurity: Not on file  ?Transportation Needs: Not on file  ?Physical Activity: Not on file  ?Stress: Not on file  ?Social Connections: Not on file  ?Intimate Partner Violence: Not on file  ? ? ?FAMILY HISTORY:  ?Family History  ?Problem Relation Age of Onset  ? Hypertension Mother   ? Heart attack Mother   ?  Heart attack Father   ? Stroke Sister   ? Alcoholism Brother   ? Cirrhosis Brother   ? ? ?CURRENT MEDICATIONS:  ?Current Outpatient Medications  ?Medication Sig Dispense Refill  ? albuterol (PROVENTIL HFA;VENTOLIN HFA) 108 (90 Base) MCG/ACT inhaler Inhale into the lungs.     ? Cholecalciferol (VITAMIN D3) 2000 units TABS Take 2,000 Units by mouth daily.     ? cyanocobalamin 500 MCG tablet Take 500 mcg by mouth daily.    ? dorzolamide (TRUSOPT) 2 % ophthalmic solution INSTILL ONE DROP IN [facility] EYE TWICE DAILY  2  ? doxazosin (CARDURA) 8 MG tablet Take 8 mg by mouth daily.     ? Ferrous Gluconate (IRON 27 PO) Take 1 tablet every morning by mouth.    ? Glucosamine Sulfate 1000 MG CAPS Take by mouth. Takes twice a day    ? hydrochlorothiazide (HYDRODIURIL) 50 MG tablet Take 50 mg by mouth daily.     ? latanoprost (XALATAN) 0.005 % ophthalmic solution Place 1 drop into both eyes at bedtime.    ? loratadine (CLARITIN) 10 MG tablet Take 10 mg by mouth daily.     ? warfarin (COUMADIN) 5 MG tablet TAKE 1 TABLET BY MOUTH EVERY OTHER DAY ALTERNATING WITH ONE & 1/2 TABLETS EVERY OTHER DAY    ? ?No current facility-administered medications for this visit.  ? ? ?ALLERGIES:  ?No  Known Allergies ? ?PHYSICAL EXAM:  ?Performance status (ECOG): 1 - Symptomatic but completely ambulatory ? ?Vitals:  ? 01/26/22 1411  ?BP: 129/78  ?Pulse: 89  ?Resp: 18  ?Temp: 99 ?F (37.2 ?C)  ?SpO2: 98%  ? ?Wt Readings from Last 3 Encounters:  ?01/26/22 179 lb (81.2 kg)  ?07/22/21 175 lb (79.4 kg)  ?01/05/21 178 lb 9.6 oz (81 kg)  ? ?Physical Exam ?Vitals reviewed.  ?Constitutional:   ?   Appearance: Normal appearance.  ?Cardiovascular:  ?   Rate and Rhythm: Normal rate and regular rhythm.  ?   Pulses: Normal pulses.  ?   Heart sounds: Normal heart sounds.  ?Pulmonary:  ?   Effort: Pulmonary effort is normal.  ?   Breath sounds: Normal breath sounds.  ?Neurological:  ?   General: No focal deficit present.  ?   Mental Status: He is alert and oriented to  person, place, and time.  ?Psychiatric:     ?   Mood and Affect: Mood normal.     ?   Behavior: Behavior normal.  ? ? ?LABORATORY DATA:  ?I have reviewed the labs as listed.  ? ?  Latest Ref Rng & Units 01/19/2022  ? 12:55 PM 07/15/2021  ? 11:52 AM 12/29/2020  ? 11:53 AM  ?CBC  ?WBC 4.0 - 10.5 K/uL 3.4   4.1   3.2    ?Hemoglobin 13.0 - 17.0 g/dL 11.3   10.9   11.8    ?Hematocrit 39.0 - 52.0 % 35.3   34.3   37.0    ?Platelets 150 - 400 K/uL 154   153   179    ? ? ?  Latest Ref Rng & Units 01/19/2022  ? 12:55 PM 07/15/2021  ? 11:52 AM 12/29/2020  ? 11:53 AM  ?CMP  ?Glucose 70 - 99 mg/dL 84   82   75    ?BUN 8 - 23 mg/dL 22   25   17     ?Creatinine 0.61 - 1.24 mg/dL 1.49   1.41   1.32    ?Sodium 135 - 145 mmol/L 127   127   131    ?Potassium 3.5 - 5.1 mmol/L 3.8   3.7   3.9    ?Chloride 98 - 111 mmol/L 100   100   104    ?CO2 22 - 32 mmol/L 24   24   24     ?Calcium 8.9 - 10.3 mg/dL 8.8   8.7   8.7    ?Total Protein 6.5 - 8.1 g/dL 10.4   10.0   9.7    ?Total Bilirubin 0.3 - 1.2 mg/dL 0.7   0.5   0.6    ?Alkaline Phos 38 - 126 U/L 40   42   39    ?AST 15 - 41 U/L 25   24   24     ?ALT 0 - 44 U/L 12   14   14     ? ?   ?Component Value Date/Time  ? RBC 3.40 (L) 01/19/2022 1255  ? MCV 103.8 (H) 01/19/2022 1255  ? MCH 33.2 01/19/2022 1255  ? MCHC 32.0 01/19/2022 1255  ? RDW 14.4 01/19/2022 1255  ? LYMPHSABS 1.0 01/19/2022 1255  ? MONOABS 0.5 01/19/2022 1255  ? EOSABS 0.1 01/19/2022 1255  ? BASOSABS 0.0 01/19/2022 1255  ? ? ?DIAGNOSTIC IMAGING:  ?I have independently reviewed the scans and discussed with the patient. ?No results found.  ? ?ASSESSMENT:  ?1.  Smoldering  myeloma: ?-BMBX on 01/07/2016 at [facility] showed trilineage hematopoiesis, plasma cells 13%, FISH panel showed an additional IGH/14 q. signal.  Gain of 9 chromosome and CCN D1/11 q.  Chromosome analysis 48, XY. ?-PET scan on 12/24/2019 showed no lytic lesions or suspicious lesions for myeloma. ?  ?2.  Recurrent leg DVT: ?-He has been on Coumadin for the past 13 years  since he had PE in 2006. ?-Doppler on 08/09/2018 showed acute thrombus in the left popliteal and femoral vein.  Coumadin was changed to Xarelto, which was later switched to Eliquis. ?  ?3.  Mediastinal adenopathy: ?-PET scan on 12/24/2019 did not show any mediastinal adenopathy.  However it was seen on PET scan in 2018. ? ? ?PLAN:  ?1.  Smoldering myeloma: ?- He does not report any new onset bone pains.  No infections reported. ?- Reviewed myeloma labs from 01/19/2021.  M spike is stable at 4.2.  Hemoglobin is 11.3.  Kappa light chains have gone up to 299 and ratio is 17.9.  Creatinine is 1.49 and calcium is 8.8.  Chronic hyponatremia is stable. ?- No indications for treatment at this time. ?- RTC 6 months with repeat labs.  We will also repeat skeletal survey at that time. ?  ?2.  Iron deficiency state: ?- Continue iron tablet daily.  He is also taking B12 tablet.  We will check ferritin and iron panel and B12 next visit.  Hemoglobin is 11.3. ?  ?3.  Recurrent leg DVT: ?- Continue warfarin.  No bleeding issues reported. ? ?Orders placed this encounter:  ?No orders of the defined types were placed in this encounter. ? ? ? ?Derek Jack, MD ?[place] ?[phone] ? ? ?I, Thana Ates, am acting as a scribe for Dr. Derek Jack. ? ?I, Derek Jack MD, have reviewed the above documentation for accuracy and completeness, and I agree with the above. ?  ? ? ?

## 2022-01-26 NOTE — Patient Instructions (Signed)
[place] at [facility] ?Discharge Instructions ? ?You were seen and examined today by Dr. Delton Coombes. He reviewed your most recent labs and everything looks stable. Please keep follow up appointments as scheduled in 6 months. ? ? ?Thank you for choosing [place] at [facility] to provide your oncology and hematology care.  To afford each patient quality time with our provider, please arrive at least 15 minutes before your scheduled appointment time.  ? ?If you have a lab appointment with the [facility] please come in thru the Main Entrance and check in at the main information desk. ? ?You need to re-schedule your appointment should you arrive 10 or more minutes late.  We strive to give you quality time with our providers, and arriving late affects you and other patients whose appointments are after yours.  Also, if you no show three or more times for appointments you may be dismissed from the clinic at the providers discretion.     ?Again, thank you for choosing [facility].  Our hope is that these requests will decrease the amount of time that you wait before being seen by our physicians.       ?_____________________________________________________________ ? ?Should you have questions after your visit to [facility], please contact our office at [phone] and follow the prompts.  Our office hours are 8:00 a.m. and 4:30 p.m. Monday - Friday.  Please note that voicemails left after 4:00 p.m. may not be returned until the following business day.  We are closed weekends and major holidays.  You do have access to a nurse 24-7, just call the main number to the clinic [phone] and do not press any options, hold on the line and a nurse will answer the phone.   ? ?For prescription refill requests, have your pharmacy contact our office and allow 72 hours.   ? ?Due to Covid, you will need to wear a mask upon entering the hospital. If you  do not have a mask, a mask will be given to you at the Main Entrance upon arrival. For doctor visits, patients may have 1 support person age 15 or older with them. For treatment visits, patients can not have anyone with them due to social distancing guidelines and our immunocompromised population.  ? ?  ?

## 2022-08-03 ENCOUNTER — Inpatient Hospital Stay: Payer: Medicare FFS | Attending: Hematology

## 2022-08-03 DIAGNOSIS — I82432 Acute embolism and thrombosis of left popliteal vein: Secondary | ICD-10-CM | POA: Insufficient documentation

## 2022-08-03 DIAGNOSIS — I82412 Acute embolism and thrombosis of left femoral vein: Secondary | ICD-10-CM | POA: Insufficient documentation

## 2022-08-03 DIAGNOSIS — E611 Iron deficiency: Secondary | ICD-10-CM | POA: Diagnosis not present

## 2022-08-03 DIAGNOSIS — D472 Monoclonal gammopathy: Secondary | ICD-10-CM | POA: Diagnosis present

## 2022-08-03 DIAGNOSIS — Z7901 Long term (current) use of anticoagulants: Secondary | ICD-10-CM | POA: Insufficient documentation

## 2022-08-03 DIAGNOSIS — Z86711 Personal history of pulmonary embolism: Secondary | ICD-10-CM | POA: Insufficient documentation

## 2022-08-03 DIAGNOSIS — R590 Localized enlarged lymph nodes: Secondary | ICD-10-CM | POA: Diagnosis not present

## 2022-08-03 LAB — IRON AND TIBC
Iron: 45 ug/dL (ref 45–182)
Saturation Ratios: 15 % — ABNORMAL LOW (ref 17.9–39.5)
TIBC: 301 ug/dL (ref 250–450)
UIBC: 256 ug/dL

## 2022-08-03 LAB — CBC WITH DIFFERENTIAL/PLATELET
Abs Immature Granulocytes: 0.01 10*3/uL (ref 0.00–0.07)
Basophils Absolute: 0 10*3/uL (ref 0.0–0.1)
Basophils Relative: 0 %
Eosinophils Absolute: 0.1 10*3/uL (ref 0.0–0.5)
Eosinophils Relative: 2 %
HCT: 32.4 % — ABNORMAL LOW (ref 39.0–52.0)
Hemoglobin: 10.6 g/dL — ABNORMAL LOW (ref 13.0–17.0)
Immature Granulocytes: 0 %
Lymphocytes Relative: 32 %
Lymphs Abs: 1 10*3/uL (ref 0.7–4.0)
MCH: 33.2 pg (ref 26.0–34.0)
MCHC: 32.7 g/dL (ref 30.0–36.0)
MCV: 101.6 fL — ABNORMAL HIGH (ref 80.0–100.0)
Monocytes Absolute: 0.5 10*3/uL (ref 0.1–1.0)
Monocytes Relative: 15 %
Neutro Abs: 1.6 10*3/uL — ABNORMAL LOW (ref 1.7–7.7)
Neutrophils Relative %: 51 %
Platelets: 185 10*3/uL (ref 150–400)
RBC: 3.19 MIL/uL — ABNORMAL LOW (ref 4.22–5.81)
RDW: 14.4 % (ref 11.5–15.5)
WBC: 3.1 10*3/uL — ABNORMAL LOW (ref 4.0–10.5)
nRBC: 0 % (ref 0.0–0.2)

## 2022-08-03 LAB — FERRITIN: Ferritin: 36 ng/mL (ref 24–336)

## 2022-08-03 LAB — COMPREHENSIVE METABOLIC PANEL
ALT: 13 U/L (ref 0–44)
AST: 28 U/L (ref 15–41)
Albumin: 3.4 g/dL — ABNORMAL LOW (ref 3.5–5.0)
Alkaline Phosphatase: 42 U/L (ref 38–126)
Anion gap: 5 (ref 5–15)
BUN: 24 mg/dL — ABNORMAL HIGH (ref 8–23)
CO2: 21 mmol/L — ABNORMAL LOW (ref 22–32)
Calcium: 9.3 mg/dL (ref 8.9–10.3)
Chloride: 101 mmol/L (ref 98–111)
Creatinine, Ser: 1.49 mg/dL — ABNORMAL HIGH (ref 0.61–1.24)
GFR, Estimated: 45 mL/min — ABNORMAL LOW (ref >60)
Glucose, Bld: 83 mg/dL (ref 70–99)
Potassium: 3.9 mmol/L (ref 3.5–5.1)
Sodium: 127 mmol/L — ABNORMAL LOW (ref 135–145)
Total Bilirubin: 0.7 mg/dL (ref 0.3–1.2)
Total Protein: 10.8 g/dL — ABNORMAL HIGH (ref 6.5–8.1)

## 2022-08-03 LAB — VITAMIN B12: Vitamin B-12: 1905 pg/mL — ABNORMAL HIGH (ref 180–914)

## 2022-08-03 LAB — LACTATE DEHYDROGENASE: LDH: 170 U/L (ref 98–192)

## 2022-08-04 LAB — KAPPA/LAMBDA LIGHT CHAINS
Kappa free light chain: 306.2 mg/L — ABNORMAL HIGH (ref 3.3–19.4)
Kappa, lambda light chain ratio: 18.34 — ABNORMAL HIGH (ref 0.26–1.65)
Lambda free light chains: 16.7 mg/L (ref 5.7–26.3)

## 2022-08-06 LAB — PROTEIN ELECTROPHORESIS, SERUM
A/G Ratio: 0.6 — ABNORMAL LOW (ref 0.7–1.7)
Albumin ELP: 3.9 g/dL (ref 2.9–4.4)
Alpha-1-Globulin: 0.3 g/dL (ref 0.0–0.4)
Alpha-2-Globulin: 0.6 g/dL (ref 0.4–1.0)
Beta Globulin: 0.9 g/dL (ref 0.7–1.3)
Gamma Globulin: 4.9 g/dL — ABNORMAL HIGH (ref 0.4–1.8)
Globulin, Total: 6.7 g/dL — ABNORMAL HIGH (ref 2.2–3.9)
M-Spike, %: 4.6 g/dL — ABNORMAL HIGH
Total Protein ELP: 10.6 g/dL — ABNORMAL HIGH (ref 6.0–8.5)

## 2022-08-10 ENCOUNTER — Ambulatory Visit (HOSPITAL_COMMUNITY)
Admission: RE | Admit: 2022-08-10 | Discharge: 2022-08-10 | Disposition: A | Discharge status: Home / Self Care | Payer: Medicare FFS | Source: Ambulatory Visit | Attending: Hematology | Admitting: Hematology

## 2022-08-10 ENCOUNTER — Inpatient Hospital Stay: Payer: Medicare FFS | Admitting: Hematology

## 2022-08-10 VITALS — BP 143/72 | HR 57 | Temp 97.6°F | Resp 17 | Ht 71.0 in | Wt 174.0 lb

## 2022-08-10 DIAGNOSIS — C9000 Multiple myeloma not having achieved remission: Secondary | ICD-10-CM | POA: Diagnosis not present

## 2022-08-10 DIAGNOSIS — D472 Monoclonal gammopathy: Secondary | ICD-10-CM | POA: Insufficient documentation

## 2022-08-10 NOTE — Patient Instructions (Signed)
[facility]  Discharge Instructions  You were seen and examined today by Dr. Delton Coombes.  Dr. Delton Coombes discussed your most recent lab work and bone scan which revealed that everything looks stable except the abnormal protein has slightly increased.  Follow-up as scheduled in 6 months.    Thank you for choosing [facility] to provide your oncology and hematology care.   To afford each patient quality time with our provider, please arrive at least 15 minutes before your scheduled appointment time. You may need to reschedule your appointment if you arrive late (10 or more minutes). Arriving late affects you and other patients whose appointments are after yours.  Also, if you miss three or more appointments without notifying the office, you may be dismissed from the clinic at the provider's discretion.    Again, thank you for choosing [facility].  Our hope is that these requests will decrease the amount of time that you wait before being seen by our physicians.   If you have a lab appointment with the [facility] please come in thru the Main Entrance and check in at the main information desk.           _____________________________________________________________  Should you have questions after your visit to [facility], please contact our office at [phone] and follow the prompts.  Our office hours are 8:00 a.m. to 4:30 p.m. Monday - Thursday and 8:00 a.m. to 2:30 p.m. Friday.  Please note that voicemails left after 4:00 p.m. may not be returned until the following business day.  We are closed weekends and all major holidays.  You do have access to a nurse 24-7, just call the main number to the clinic [phone] and do not press any options, hold on the line and a nurse will answer the phone.    For prescription refill requests, have your pharmacy contact our office and allow 72 hours.    Masks are  optional in the cancer centers. If you would like for your care team to wear a mask while they are taking care of you, please let them know. You may have one support person who is at least 86 years old accompany you for your appointments.

## 2022-08-10 NOTE — Progress Notes (Signed)
[facility], [place] [address]   CLINIC:  Medical Oncology/Hematology  PCP:  Neale Burly, MD [place] / [place] [address]  [phone]  REASON FOR VISIT:  Follow-up for smoldering myeloma  PRIOR THERAPY: none  CURRENT THERAPY: surveillance  INTERVAL HISTORY:  [name], a 88 y.o. male, seen for follow-up of smoldering myeloma.  Denies any new onset pains.  Denies any tingling or numbness in extremities.  Denies any recent infections in the last 6 months.  REVIEW OF SYSTEMS:  Review of Systems  Constitutional:  Negative for appetite change and fatigue.  Neurological:  Positive for dizziness.  All other systems reviewed and are negative.   PAST MEDICAL/SURGICAL HISTORY:  Past Medical History:  Diagnosis Date   Anemia    Glaucoma    Hypertension    No past surgical history on file.  SOCIAL HISTORY:  Social History   Socioeconomic History   Marital status: Widowed    Spouse name: Not on file   Number of children: Not on file   Years of education: Not on file   Highest education level: Not on file  Occupational History   Not on file  Tobacco Use   Smoking status: Never   Smokeless tobacco: Never  Substance and Sexual Activity   Alcohol use: No   Drug use: No   Sexual activity: Not Currently  Other Topics Concern   Not on file  Social History Narrative   Not on file   Social Determinants of Health   Financial Resource Strain: Low Risk  (10/01/2020)   Overall Financial Resource Strain (CARDIA)    Difficulty of Paying Living Expenses: Not hard at all  Food Insecurity: No Food Insecurity (10/01/2020)   Hunger Vital Sign    Worried About Running Out of Food in the Last Year: Never true    [place] in the Last Year: Never true  Transportation Needs: No Transportation Needs (10/01/2020)   PRAPARE - Hydrologist (Medical): No    Lack of Transportation  (Non-Medical): No  Physical Activity: Inactive (10/01/2020)   Exercise Vital Sign    Days of Exercise per Week: 0 days    Minutes of Exercise per Session: 0 min  Stress: Stress Concern Present (10/01/2020)   [place]    Feeling of Stress : To some extent  Social Connections: Moderately Isolated (10/01/2020)   Social Connection and Isolation Panel [NHANES]    Frequency of Communication with Friends and Family: More than three times a week    Frequency of Social Gatherings with Friends and Family: Never    Attends Religious Services: More than 4 times per year    Active Member of [employer] or Organizations: No    Attends Archivist Meetings: Never    Marital Status: Widowed  Intimate Partner Violence: Not At Risk (10/01/2020)   Humiliation, Afraid, Rape, and Kick questionnaire    Fear of Current or Ex-Partner: No    Emotionally Abused: No    Physically Abused: No    Sexually Abused: No    FAMILY HISTORY:  Family History  Problem Relation Age of Onset   Hypertension Mother    Heart attack Mother    Heart attack Father    Stroke Sister    Alcoholism Brother    Cirrhosis Brother     CURRENT MEDICATIONS:  Current  Outpatient Medications  Medication Sig Dispense Refill   albuterol (PROVENTIL HFA;VENTOLIN HFA) 108 (90 Base) MCG/ACT inhaler Inhale into the lungs.      Cholecalciferol (VITAMIN D3) 2000 units TABS Take 2,000 Units by mouth daily.      cyanocobalamin 500 MCG tablet Take 500 mcg by mouth daily.     dorzolamide (TRUSOPT) 2 % ophthalmic solution INSTILL ONE DROP IN EACH EYE TWICE DAILY  2   doxazosin (CARDURA) 8 MG tablet Take 8 mg by mouth daily.      Ferrous Gluconate (IRON 27 PO) Take 1 tablet every morning by mouth.     Glucosamine Sulfate 1000 MG CAPS Take by mouth. Takes twice a day     hydrochlorothiazide (HYDRODIURIL) 50 MG tablet Take 50 mg by mouth daily.      latanoprost (XALATAN) 0.005 %  ophthalmic solution Place 1 drop into both eyes at bedtime.     loratadine (CLARITIN) 10 MG tablet Take 10 mg by mouth daily.      sodium bicarbonate 650 MG tablet Take 650 mg by mouth daily.     warfarin (COUMADIN) 5 MG tablet TAKE 1 TABLET BY MOUTH EVERY OTHER DAY ALTERNATING WITH ONE & 1/2 TABLETS EVERY OTHER DAY     No current facility-administered medications for this visit.    ALLERGIES:  No Known Allergies  PHYSICAL EXAM:  Performance status (ECOG): 1 - Symptomatic but completely ambulatory  Vitals:   08/10/22 1421  BP: (!) 143/72  Pulse: (!) 57  Resp: 17  Temp: 97.6 F (36.4 C)  SpO2: 100%   Wt Readings from Last 3 Encounters:  08/10/22 174 lb (78.9 kg)  01/26/22 179 lb (81.2 kg)  07/22/21 175 lb (79.4 kg)   Physical Exam Vitals reviewed.  Constitutional:      Appearance: Normal appearance.  Cardiovascular:     Rate and Rhythm: Normal rate and regular rhythm.     Pulses: Normal pulses.     Heart sounds: Normal heart sounds.  Pulmonary:     Effort: Pulmonary effort is normal.     Breath sounds: Normal breath sounds.  Neurological:     General: No focal deficit present.     Mental Status: He is alert and oriented to person, place, and time.  Psychiatric:        Mood and Affect: Mood normal.        Behavior: Behavior normal.     LABORATORY DATA:  I have reviewed the labs as listed.     Latest Ref Rng & Units 08/03/2022    2:00 PM 01/19/2022   12:55 PM 07/15/2021   11:52 AM  CBC  WBC 4.0 - 10.5 K/uL 3.1  3.4  4.1   Hemoglobin 13.0 - 17.0 g/dL 10.6  11.3  10.9   Hematocrit 39.0 - 52.0 % 32.4  35.3  34.3   Platelets 150 - 400 K/uL 185  154  153       Latest Ref Rng & Units 08/03/2022    2:00 PM 01/19/2022   12:55 PM 07/15/2021   11:52 AM  CMP  Glucose 70 - 99 mg/dL 83  84  82   BUN 8 - 23 mg/dL 24  22  25    Creatinine 0.61 - 1.24 mg/dL 1.49  1.49  1.41   Sodium 135 - 145 mmol/L 127  127  127   Potassium 3.5 - 5.1 mmol/L 3.9  3.8  3.7   Chloride 98  - 111 mmol/L 101  100  100   CO2 22 - 32 mmol/L 21  24  24    Calcium 8.9 - 10.3 mg/dL 9.3  8.8  8.7   Total Protein 6.5 - 8.1 g/dL 10.8  10.4  10.0   Total Bilirubin 0.3 - 1.2 mg/dL 0.7  0.7  0.5   Alkaline Phos 38 - 126 U/L 42  40  42   AST 15 - 41 U/L 28  25  24    ALT 0 - 44 U/L 13  12  14        Component Value Date/Time   RBC 3.19 (L) 08/03/2022 1400   MCV 101.6 (H) 08/03/2022 1400   MCH 33.2 08/03/2022 1400   MCHC 32.7 08/03/2022 1400   RDW 14.4 08/03/2022 1400   LYMPHSABS 1.0 08/03/2022 1400   MONOABS 0.5 08/03/2022 1400   EOSABS 0.1 08/03/2022 1400   BASOSABS 0.0 08/03/2022 1400    DIAGNOSTIC IMAGING:  I have independently reviewed the scans and discussed with the patient. No results found.   ASSESSMENT:  1.  Smoldering myeloma: -BMBX on 01/07/2016 at [facility] showed trilineage hematopoiesis, plasma cells 13%, FISH panel showed an additional IGH/14 q. signal.  Gain of 9 chromosome and CCN D1/11 q.  Chromosome analysis 48, XY. -PET scan on 12/24/2019 showed no lytic lesions or suspicious lesions for myeloma.   2.  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 -Doppler on 08/09/2018 showed acute thrombus in the left popliteal and femoral vein.  Coumadin was changed to Xarelto, which was later switched to Eliquis.   3.  Mediastinal adenopathy: -PET scan on 12/24/2019 did not show any mediastinal adenopathy.  However it was seen on PET scan in 2018.   PLAN:  1.  Smoldering myeloma: - He does not report any new onset bone pains. - Reviewed myeloma labs from 08/03/2022.  M spike is 4.6 g, up from 4.2 g previously. - Kappa light chains are 306 and ratio is 18.34, up from 17.9 previously.  Creatinine is 1.49 and stable.  Calcium is normal at 9.3 with almond 3.4.  Hemoglobin is 10.6 and more or less stable. - He had a skeletal survey today.  I looked at the left humerus which is stable.  Radiology report is pending. - Recommend  follow-up in 6 months with repeat labs.   2.  Iron deficiency state: - Continue iron tablet daily and B12 daily. - Ferritin is 36 and percent saturation 15.  B12 is normal.  Hemoglobin is 10.6, slightly down from last visit.   3.  Recurrent leg DVT: - Continue warfarin.  No bleeding issues reported.  Orders placed this encounter:  Orders Placed This Encounter  Procedures   CBC with Differential/Platelet   Comprehensive metabolic panel   Ferritin   Iron and TIBC   Protein electrophoresis, serum   Kappa/lambda light chains   Lactate dehydrogenase      Derek Jack, MD [place] [phone]

## 2023-01-25 ENCOUNTER — Encounter (HOSPITAL_COMMUNITY): Payer: Self-pay | Admitting: Hematology & Oncology

## 2023-02-09 ENCOUNTER — Inpatient Hospital Stay: Payer: Medicare FFS | Attending: Hematology

## 2023-02-09 DIAGNOSIS — Z86711 Personal history of pulmonary embolism: Secondary | ICD-10-CM | POA: Insufficient documentation

## 2023-02-09 DIAGNOSIS — Z7901 Long term (current) use of anticoagulants: Secondary | ICD-10-CM | POA: Insufficient documentation

## 2023-02-09 DIAGNOSIS — Z86718 Personal history of other venous thrombosis and embolism: Secondary | ICD-10-CM | POA: Diagnosis not present

## 2023-02-09 DIAGNOSIS — E611 Iron deficiency: Secondary | ICD-10-CM | POA: Diagnosis not present

## 2023-02-09 DIAGNOSIS — R590 Localized enlarged lymph nodes: Secondary | ICD-10-CM | POA: Insufficient documentation

## 2023-02-09 DIAGNOSIS — D472 Monoclonal gammopathy: Secondary | ICD-10-CM | POA: Diagnosis not present

## 2023-02-09 DIAGNOSIS — C9000 Multiple myeloma not having achieved remission: Secondary | ICD-10-CM

## 2023-02-09 LAB — CBC WITH DIFFERENTIAL/PLATELET
Abs Immature Granulocytes: 0.01 10*3/uL (ref 0.00–0.07)
Basophils Absolute: 0 10*3/uL (ref 0.0–0.1)
Basophils Relative: 0 %
Eosinophils Absolute: 0.1 10*3/uL (ref 0.0–0.5)
Eosinophils Relative: 1 %
HCT: 34.6 % — ABNORMAL LOW (ref 39.0–52.0)
Hemoglobin: 11.2 g/dL — ABNORMAL LOW (ref 13.0–17.0)
Immature Granulocytes: 0 %
Lymphocytes Relative: 26 %
Lymphs Abs: 0.9 10*3/uL (ref 0.7–4.0)
MCH: 33 pg (ref 26.0–34.0)
MCHC: 32.4 g/dL (ref 30.0–36.0)
MCV: 102.1 fL — ABNORMAL HIGH (ref 80.0–100.0)
Monocytes Absolute: 0.7 10*3/uL (ref 0.1–1.0)
Monocytes Relative: 19 %
Neutro Abs: 1.9 10*3/uL (ref 1.7–7.7)
Neutrophils Relative %: 54 %
Platelets: 171 10*3/uL (ref 150–400)
RBC: 3.39 MIL/uL — ABNORMAL LOW (ref 4.22–5.81)
RDW: 15.2 % (ref 11.5–15.5)
WBC: 3.5 10*3/uL — ABNORMAL LOW (ref 4.0–10.5)
nRBC: 0 % (ref 0.0–0.2)

## 2023-02-09 LAB — COMPREHENSIVE METABOLIC PANEL
ALT: 12 U/L (ref 0–44)
AST: 23 U/L (ref 15–41)
Albumin: 3.1 g/dL — ABNORMAL LOW (ref 3.5–5.0)
Alkaline Phosphatase: 39 U/L (ref 38–126)
Anion gap: 4 — ABNORMAL LOW (ref 5–15)
BUN: 20 mg/dL (ref 8–23)
CO2: 23 mmol/L (ref 22–32)
Calcium: 8.9 mg/dL (ref 8.9–10.3)
Chloride: 100 mmol/L (ref 98–111)
Creatinine, Ser: 1.53 mg/dL — ABNORMAL HIGH (ref 0.61–1.24)
GFR, Estimated: 43 mL/min — ABNORMAL LOW (ref >60)
Glucose, Bld: 87 mg/dL (ref 70–99)
Potassium: 3.9 mmol/L (ref 3.5–5.1)
Sodium: 127 mmol/L — ABNORMAL LOW (ref 135–145)
Total Bilirubin: 0.6 mg/dL (ref 0.3–1.2)
Total Protein: 10.4 g/dL — ABNORMAL HIGH (ref 6.5–8.1)

## 2023-02-09 LAB — IRON AND TIBC
Iron: 36 ug/dL — ABNORMAL LOW (ref 45–182)
Saturation Ratios: 13 % — ABNORMAL LOW (ref 17.9–39.5)
TIBC: 277 ug/dL (ref 250–450)
UIBC: 241 ug/dL

## 2023-02-09 LAB — FERRITIN: Ferritin: 39 ng/mL (ref 24–336)

## 2023-02-09 LAB — LACTATE DEHYDROGENASE: LDH: 158 U/L (ref 98–192)

## 2023-02-10 LAB — KAPPA/LAMBDA LIGHT CHAINS
Kappa free light chain: 335.9 mg/L — ABNORMAL HIGH (ref 3.3–19.4)
Kappa, lambda light chain ratio: 18.26 — ABNORMAL HIGH (ref 0.26–1.65)
Lambda free light chains: 18.4 mg/L (ref 5.7–26.3)

## 2023-02-11 ENCOUNTER — Other Ambulatory Visit: Payer: Self-pay | Admitting: *Deleted

## 2023-02-11 DIAGNOSIS — K4090 Unilateral inguinal hernia, without obstruction or gangrene, not specified as recurrent: Secondary | ICD-10-CM

## 2023-02-11 LAB — PROTEIN ELECTROPHORESIS, SERUM
A/G Ratio: 0.6 — ABNORMAL LOW (ref 0.7–1.7)
Albumin ELP: 3.9 g/dL (ref 2.9–4.4)
Alpha-1-Globulin: 0.2 g/dL (ref 0.0–0.4)
Alpha-2-Globulin: 0.7 g/dL (ref 0.4–1.0)
Beta Globulin: 0.9 g/dL (ref 0.7–1.3)
Gamma Globulin: 4.8 g/dL — ABNORMAL HIGH (ref 0.4–1.8)
Globulin, Total: 6.6 g/dL — ABNORMAL HIGH (ref 2.2–3.9)
M-Spike, %: 4.4 g/dL — ABNORMAL HIGH
Total Protein ELP: 10.5 g/dL — ABNORMAL HIGH (ref 6.0–8.5)

## 2023-02-15 ENCOUNTER — Ambulatory Visit: Payer: Medicare FFS | Admitting: General Surgery

## 2023-02-15 ENCOUNTER — Encounter: Payer: Self-pay | Admitting: General Surgery

## 2023-02-15 VITALS — BP 130/67 | HR 65 | Temp 98.2°F | Resp 12 | Ht 71.0 in | Wt 174.0 lb

## 2023-02-15 DIAGNOSIS — Z86718 Personal history of other venous thrombosis and embolism: Secondary | ICD-10-CM

## 2023-02-15 DIAGNOSIS — K4090 Unilateral inguinal hernia, without obstruction or gangrene, not specified as recurrent: Secondary | ICD-10-CM | POA: Diagnosis not present

## 2023-02-15 NOTE — Progress Notes (Signed)
[facility] [address] [place] [address]    Clinic Day:  02/16/2023  Referring physician: Toma Deiters, MD  Patient Care Team: Toma Deiters, MD as PCP - General (Internal Medicine)   ASSESSMENT & PLAN:   Assessment: 1.  Smoldering myeloma: -BMBX on 01/07/2016 at [facility] showed trilineage hematopoiesis, plasma cells 13%, FISH panel showed an additional IGH/14 q. signal.  Gain of 9 chromosome and CCN D1/11 q.  Chromosome analysis 48, XY. -PET scan on 12/24/2019 showed no lytic lesions or suspicious lesions for myeloma.   2.  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 -Doppler on 08/09/2018 showed acute thrombus in the left popliteal and femoral vein.  Coumadin was changed to Xarelto, which was later switched to Eliquis.   3.  Mediastinal adenopathy: -PET scan on 12/24/2019 did not show any mediastinal adenopathy.  However it was seen on PET scan in 2018.    Plan: 1.  Smoldering myeloma: - No B symptoms or new onset pains. - Myeloma panel from 02/09/2023: M spike stable at 4.4 g.  Kappa light chain slightly increased to 335 with ratio of 18.26.  No "crab" features. - Recommend follow-up in 6 months with repeat myeloma labs and skeletal survey. - He is planning on having right abdominal hernia repair surgery on 02/28/2023 with Dr. Henreitta Leber.  I have recommended that he may hold anticoagulation 5 days prior to surgery and start back at the discretion of Dr. Henreitta Leber.   2.  Iron deficiency state: - Continue iron tablet and B12 tablet daily.  Ferritin is 39.   3.  Recurrent leg DVT: - Continue warfarin.  No bleeding issues reported.    Orders Placed This Encounter  Procedures   DG Bone Survey Met    Standing Status:   Future    Standing Expiration Date:   02/16/2024    Order Specific Question:   Reason for Exam (SYMPTOM  OR DIAGNOSIS REQUIRED)    Answer:   multiple myeloma    Order Specific Question:    Preferred imaging location?    Answer:   [facility]    Order Specific Question:   Release to patient    Answer:   Immediate   CBC with Differential/Platelet    Standing Status:   Future    Standing Expiration Date:   02/16/2024    Order Specific Question:   Release to patient    Answer:   Immediate   Comprehensive metabolic panel    Standing Status:   Future    Standing Expiration Date:   02/16/2024    Order Specific Question:   Release to patient    Answer:   Immediate   Protein electrophoresis, serum    Standing Status:   Future    Standing Expiration Date:   02/16/2024    Order Specific Question:   Release to patient    Answer:   Immediate   Kappa/lambda light chains    Standing Status:   Future    Standing Expiration Date:   02/16/2024    Order Specific Question:   Release to patient    Answer:   Immediate   Ferritin    Standing Status:   Future    Standing Expiration Date:   02/16/2024    Order Specific Question:   Release to patient    Answer:   Immediate   Iron and TIBC  Standing Status:   Future    Standing Expiration Date:   02/16/2024    Order Specific Question:   Release to patient    Answer:   Immediate      I,Katie Daubenspeck,acting as a scribe for Doreatha Massed, MD.,have documented all relevant documentation on the behalf of Doreatha Massed, MD,as directed by  Doreatha Massed, MD while in the presence of Doreatha Massed, MD.   I, Doreatha Massed MD, have reviewed the above documentation for accuracy and completeness, and I agree with the above.   Doreatha Massed, MD   4/24/20245:24 PM  CHIEF COMPLAINT:   Diagnosis: smoldering myeloma    Cancer Staging  No matching staging information was found for the patient.   Prior Therapy: none  Current Therapy:  surveillance    HISTORY OF PRESENT ILLNESS:   Oncology History   No history exists.     INTERVAL HISTORY:   Haze is a 89 y.o. male presenting to clinic  today for follow up of smoldering myeloma. He was last seen by me on 08/10/22.  Today, he states that he is doing well overall. His appetite level is at 100%. His energy level is at 75%.  PAST MEDICAL HISTORY:   Past Medical History: Past Medical History:  Diagnosis Date   Anemia    Glaucoma    Hypertension     Surgical History: History reviewed. No pertinent surgical history.  Social History: Social History   Socioeconomic History   Marital status: Widowed    Spouse name: Not on file   Number of children: Not on file   Years of education: Not on file   Highest education level: Not on file  Occupational History   Not on file  Tobacco Use   Smoking status: Never   Smokeless tobacco: Never  Substance and Sexual Activity   Alcohol use: No   Drug use: No   Sexual activity: Not Currently  Other Topics Concern   Not on file  Social History Narrative   Not on file   Social Determinants of Health   Financial Resource Strain: Low Risk  (10/01/2020)   Overall Financial Resource Strain (CARDIA)    Difficulty of Paying Living Expenses: Not hard at all  Food Insecurity: No Food Insecurity (10/01/2020)   Hunger Vital Sign    Worried About Running Out of Food in the Last Year: Never true    Ran Out of Food in the Last Year: Never true  Transportation Needs: No Transportation Needs (10/01/2020)   PRAPARE - Administrator, Civil Service (Medical): No    Lack of Transportation (Non-Medical): No  Physical Activity: Inactive (10/01/2020)   Exercise Vital Sign    Days of Exercise per Week: 0 days    Minutes of Exercise per Session: 0 min  Stress: Stress Concern Present (10/01/2020)   [employer] of Occupational Health - Occupational Stress Questionnaire    Feeling of Stress : To some extent  Social Connections: Moderately Isolated (10/01/2020)   Social Connection and Isolation Panel [NHANES]    Frequency of Communication with Friends and Family: More than three  times a week    Frequency of Social Gatherings with Friends and Family: Never    Attends Religious Services: More than 4 times per year    Active Member of [employer] or Organizations: No    Attends Banker Meetings: Never    Marital Status: Widowed  Intimate Partner Violence: Not At Risk (10/01/2020)   Humiliation, Afraid,  Rape, and Kick questionnaire    Fear of Current or Ex-Partner: No    Emotionally Abused: No    Physically Abused: No    Sexually Abused: No    Family History: Family History  Problem Relation Age of Onset   Hypertension Mother    Heart attack Mother    Heart attack Father    Stroke Sister    Alcoholism Brother    Cirrhosis Brother     Current Medications:  Current Outpatient Medications:    albuterol (PROVENTIL HFA;VENTOLIN HFA) 108 (90 Base) MCG/ACT inhaler, Inhale into the lungs. , Disp: , Rfl:    Cholecalciferol (VITAMIN D3) 2000 units TABS, Take 2,000 Units by mouth daily. , Disp: , Rfl:    cyanocobalamin 500 MCG tablet, Take 500 mcg by mouth daily., Disp: , Rfl:    dorzolamide (TRUSOPT) 2 % ophthalmic solution, INSTILL ONE DROP IN [facility] EYE TWICE DAILY, Disp: , Rfl: 2   doxazosin (CARDURA) 8 MG tablet, Take 8 mg by mouth daily. , Disp: , Rfl:    Ferrous Gluconate (IRON 27 PO), Take 1 tablet every morning by mouth., Disp: , Rfl:    Glucosamine Sulfate 1000 MG CAPS, Take by mouth. Takes twice a day, Disp: , Rfl:    hydrochlorothiazide (HYDRODIURIL) 50 MG tablet, Take 50 mg by mouth daily. , Disp: , Rfl:    latanoprost (XALATAN) 0.005 % ophthalmic solution, Place 1 drop into both eyes at bedtime., Disp: , Rfl:    loratadine (CLARITIN) 10 MG tablet, Take 10 mg by mouth daily. , Disp: , Rfl:    sodium bicarbonate 650 MG tablet, Take 650 mg by mouth daily., Disp: , Rfl:    warfarin (COUMADIN) 5 MG tablet, TAKE 1 TABLET BY MOUTH EVERY OTHER DAY ALTERNATING WITH ONE & 1/2 TABLETS EVERY OTHER DAY, Disp: , Rfl:    Allergies: No Known Allergies  REVIEW  OF SYSTEMS:   Review of Systems  Constitutional:  Negative for chills, fatigue and fever.  HENT:   Negative for lump/mass, mouth sores, nosebleeds, sore throat and trouble swallowing.   Eyes:  Negative for eye problems.  Respiratory:  Positive for shortness of breath. Negative for cough.   Cardiovascular:  Negative for chest pain, leg swelling and palpitations.  Gastrointestinal:  Negative for abdominal pain, constipation, diarrhea, nausea and vomiting.  Genitourinary:  Negative for bladder incontinence, difficulty urinating, dysuria, frequency, hematuria and nocturia.   Musculoskeletal:  Negative for arthralgias, back pain, flank pain, myalgias and neck pain.  Skin:  Negative for itching and rash.  Neurological:  Negative for dizziness, headaches and numbness.  Hematological:  Does not bruise/bleed easily.  Psychiatric/Behavioral:  Negative for depression, sleep disturbance and suicidal ideas. The patient is not nervous/anxious.   All other systems reviewed and are negative.    VITALS:   Blood pressure 138/74, pulse 73, temperature 97.6 F (36.4 C), temperature source Oral, resp. rate 18, weight 171 lb 9.6 oz (77.8 kg), SpO2 100 %.  Wt Readings from Last 3 Encounters:  02/16/23 171 lb 9.6 oz (77.8 kg)  02/15/23 174 lb (78.9 kg)  08/10/22 174 lb (78.9 kg)    Body mass index is 23.93 kg/m.  Performance status (ECOG): 1 - Symptomatic but completely ambulatory  PHYSICAL EXAM:   Physical Exam Vitals and nursing note reviewed. Exam conducted with a chaperone present.  Constitutional:      Appearance: Normal appearance.  Cardiovascular:     Rate and Rhythm: Normal rate and regular rhythm.  Pulses: Normal pulses.     Heart sounds: Normal heart sounds.  Pulmonary:     Effort: Pulmonary effort is normal.     Breath sounds: Normal breath sounds.  Abdominal:     Palpations: Abdomen is soft. There is no hepatomegaly, splenomegaly or mass.     Tenderness: There is no abdominal  tenderness.  Musculoskeletal:     Right lower leg: No edema.     Left lower leg: No edema.  Lymphadenopathy:     Cervical: No cervical adenopathy.     Right cervical: No superficial, deep or posterior cervical adenopathy.    Left cervical: No superficial, deep or posterior cervical adenopathy.     Upper Body:     Right upper body: No supraclavicular or axillary adenopathy.     Left upper body: No supraclavicular or axillary adenopathy.  Neurological:     General: No focal deficit present.     Mental Status: He is alert and oriented to person, place, and time.  Psychiatric:        Mood and Affect: Mood normal.        Behavior: Behavior normal.     LABS:      Latest Ref Rng & Units 02/09/2023    1:25 PM 08/03/2022    2:00 PM 01/19/2022   12:55 PM  CBC  WBC 4.0 - 10.5 K/uL 3.5  3.1  3.4   Hemoglobin 13.0 - 17.0 g/dL 16.1  09.6  04.5   Hematocrit 39.0 - 52.0 % 34.6  32.4  35.3   Platelets 150 - 400 K/uL 171  185  154       Latest Ref Rng & Units 02/09/2023    1:25 PM 08/03/2022    2:00 PM 01/19/2022   12:55 PM  CMP  Glucose 70 - 99 mg/dL 87  83  84   BUN 8 - 23 mg/dL Creatinine 0.61 - 1.24 mg/dL 4.09  8.11  9.14   Sodium 135 - 145 mmol/L 127  127  127   Potassium 3.5 - 5.1 mmol/L 3.9  3.9  3.8   Chloride 98 - 111 mmol/L 100  101  100   CO2 22 - 32 mmol/L Calcium 8.9 - 10.3 mg/dL 8.9  9.3  8.8   Total Protein 6.5 - 8.1 g/dL 78.2  95.6  21.3   Total Bilirubin 0.3 - 1.2 mg/dL 0.6  0.7  0.7   Alkaline Phos 38 - 126 U/L 39  42  40   AST 15 - 41 U/L ALT 0 - 44 U/L No results found for: "CEA1", "CEA" / No results found for: "CEA1", "CEA" No results found for: "PSA1" No results found for: "YQM578" No results found for: "CAN125"  Lab Results  Component Value Date   TOTALPROTELP 10.5 (H) 02/09/2023   ALBUMINELP 3.9 02/09/2023   A1GS 0.2 02/09/2023   A2GS 0.7 02/09/2023   BETS 0.9 02/09/2023   GAMS 4.8 (H)  02/09/2023   MSPIKE 4.4 (H) 02/09/2023   SPEI Comment 02/09/2023   Lab Results  Component Value Date   TIBC 277 02/09/2023   TIBC 301 08/03/2022   TIBC 285 09/24/2020   FERRITIN 39 02/09/2023   FERRITIN 36 08/03/2022   FERRITIN 39 09/24/2020   IRONPCTSAT 13 (L) 02/09/2023   IRONPCTSAT 15 (L) 08/03/2022  IRONPCTSAT 14 (L) 09/24/2020   Lab Results  Component Value Date   LDH 158 02/09/2023   LDH 170 08/03/2022   LDH 164 09/24/2020     STUDIES:   No results found.

## 2023-02-15 NOTE — Progress Notes (Signed)
[facility] History and Physical  Reason for Referral: Right inguinal hernia  Referring Physician: Toma Deiters, MD   Chief Complaint   New Patient (Initial Visit)     Dale Suarez is a 89 y.o. male.  HPI:  Dale Suarez is a very sweet man who is active and still drives and does is housework and activities of daily living. He comes in with a few weeks of having a bulge in the right groin and some burning pain. He does not lift anything more than his laundry he reports or groceries. He is active and says he does not want this hernia to get worse.   Past Medical History:  Diagnosis Date   Anemia    Glaucoma    Hypertension    Smoldering myeloma     History reviewed. No pertinent surgical history.  Family History  Problem Relation Age of Onset   Hypertension Mother    Heart attack Mother    Heart attack Father    Stroke Sister    Alcoholism Brother    Cirrhosis Brother     Social History   Tobacco Use   Smoking status: Never   Smokeless tobacco: Never  Substance Use Topics   Alcohol use: No   Drug use: No    Medications: I have reviewed the patient's current medications. Allergies as of 02/15/2023   No Known Allergies      Medication List        Accurate as of February 15, 2023 11:59 PM. If you have any questions, ask your nurse or doctor.          albuterol 108 (90 Base) MCG/ACT inhaler Commonly known as: VENTOLIN HFA Inhale into the lungs.   cyanocobalamin 500 MCG tablet Commonly known as: VITAMIN B12 Take 500 mcg by mouth daily.   dorzolamide 2 % ophthalmic solution Commonly known as: TRUSOPT INSTILL ONE DROP IN EACH EYE TWICE DAILY   doxazosin 8 MG tablet Commonly known as: CARDURA Take 8 mg by mouth daily.   enoxaparin 120 MG/0.8ML injection Commonly known as: LOVENOX Inject 0.78 mLs (117 mg total) into the skin daily for 5 days. Started by: Lucretia Roers, MD   Glucosamine Sulfate 1000 MG Caps Take by mouth.  Takes twice a day   hydrochlorothiazide 50 MG tablet Commonly known as: HYDRODIURIL Take 50 mg by mouth daily.   IRON 27 PO Take 1 tablet every morning by mouth.   latanoprost 0.005 % ophthalmic solution Commonly known as: XALATAN Place 1 drop into both eyes at bedtime.   loratadine 10 MG tablet Commonly known as: CLARITIN Take 10 mg by mouth daily.   sodium bicarbonate 650 MG tablet Take 650 mg by mouth daily.   Vitamin D3 50 MCG (2000 UT) Tabs Take 2,000 Units by mouth daily.   warfarin 5 MG tablet Commonly known as: COUMADIN TAKE 1 TABLET BY MOUTH EVERY OTHER DAY ALTERNATING WITH ONE & 1/2 TABLETS EVERY OTHER DAY         ROS:  A comprehensive review of systems was negative except for: Respiratory: positive for SOB uses albuterol Genitourinary: positive for frequency Hematologic/lymphatic: positive for DVT, smoldering myeloma   Blood pressure 130/67, pulse 65, temperature 98.2 F (36.8 C), temperature source Other (Comment), resp. rate 12, height 5\' 11"  (1.803 m), weight 174 lb (78.9 kg), SpO2 99 %. Physical Exam Vitals reviewed.  HENT:     Head: Normocephalic.     Nose: Nose normal.  Mouth/Throat:     Mouth: Mucous membranes are moist.  Eyes:     Extraocular Movements: Extraocular movements intact.  Cardiovascular:     Rate and Rhythm: Normal rate and regular rhythm.  Pulmonary:     Effort: Pulmonary effort is normal.     Breath sounds: Normal breath sounds.  Abdominal:     General: There is no distension.     Palpations: Abdomen is soft.     Tenderness: There is no abdominal tenderness.     Hernia: A hernia is present. Hernia is present in the right inguinal area. There is no hernia in the left inguinal area.     Comments: Reducible right inguinal hernia   Musculoskeletal:        General: Normal range of motion.     Cervical back: Normal range of motion.  Skin:    General: Skin is warm.  Neurological:     General: No focal deficit present.      Mental Status: He is alert and oriented to person, place, and time.  Psychiatric:        Mood and Affect: Mood normal.        Behavior: Behavior normal.        Thought Content: Thought content normal.        Judgment: Judgment normal.     Results: None    Assessment & Plan:  Dale Suarez is a 89 y.o. male with right inguinal hernia.   Discussed the risk and benefits including, bleeding, infection, use of mesh, risk of recurrence, risk of nerve damage causing numbness or changes in sensation, risk of damage to the cord structures. The patient understands the risk and benefits of repair with mesh, and has decided to proceed.  We also discussed open versus robotic assisted laparoscopic surgery and the use of mesh. We discussed that I do both robotic and open repairs with mesh, and that these are considered equivalent. We discussed reasons for opting for laparoscopic surgery including if a bilateral repair is needed or if a patient has a recurrence after an open repair. We discussed the option of watch and wait in men and discussed that in 5 years some studies report that 40% of men have crossed over to needing a hernia repair because the hernia has become larger or symptomatic. We discussed that women are not appropriate candidate for watchful waiting due to the risk of femoral hernias.   Plan for open right hernia repair with mesh given his age and longer time under anesthesia with the robotic hernia.   He is on warfarin and I discussed this with Dr. Ellin Saba regarding recurrent DVT risk and any need for bridging. He reports the last DVT was in 2019. Plan to hold warfarin 5 days and bridge with Lovenox 1.5 mg/kg once daily dose until the day before surgery. So patient's last day of Warfarin will be 4/30. He will then not take it until after surgery when we tell him to resume it. He will take a lovenox shot daily that has been sent in to [facility]. This will start 5/1-5/5. He will not take  anything the day of surgery.   My office will update him on this plan.   All questions were answered to the satisfaction of the patient.    Lucretia Roers 02/18/2023, 7:55 AM

## 2023-02-15 NOTE — Patient Instructions (Signed)
Will get Dr. Ellin Saba to decide how he wants you to stop your blood thinner given your blood clots.

## 2023-02-16 ENCOUNTER — Inpatient Hospital Stay: Payer: Medicare FFS | Admitting: Hematology

## 2023-02-16 ENCOUNTER — Encounter: Payer: Self-pay | Admitting: Hematology

## 2023-02-16 VITALS — BP 138/74 | HR 73 | Temp 97.6°F | Resp 18 | Wt 171.6 lb

## 2023-02-16 DIAGNOSIS — C9000 Multiple myeloma not having achieved remission: Secondary | ICD-10-CM | POA: Diagnosis not present

## 2023-02-16 DIAGNOSIS — D472 Monoclonal gammopathy: Secondary | ICD-10-CM | POA: Diagnosis not present

## 2023-02-16 NOTE — Patient Instructions (Addendum)
[facility]  Discharge Instructions  You were seen and examined today by Dr. Ellin Saba.  Dr. Ellin Saba discussed your most recent lab work which revealed that everything looks good and stable.  You may stop the warfarin 5 days prior to surgery.  Follow-up as scheduled in 6 months.    Thank you for choosing  [facility] to provide your oncology and hematology care.   To afford each patient quality time with our provider, please arrive at least 15 minutes before your scheduled appointment time. You may need to reschedule your appointment if you arrive late (10 or more minutes). Arriving late affects you and other patients whose appointments are after yours.  Also, if you miss three or more appointments without notifying the office, you may be dismissed from the clinic at the provider's discretion.    Again, thank you for choosing [facility].  Our hope is that these requests will decrease the amount of time that you wait before being seen by our physicians.   If you have a lab appointment with the Cancer Center - please note that after April 8th, all labs will be drawn in the cancer center.  You do not have to check in or register with the main entrance as you have in the past but will complete your check-in at the cancer center.            _____________________________________________________________  Should you have questions after your visit to [facility], please contact our office at [phone] and follow the prompts.  Our office hours are 8:00 a.m. to 4:30 p.m. Monday - Thursday and 8:00 a.m. to 2:30 p.m. Friday.  Please note that voicemails left after 4:00 p.m. may not be returned until the following business day.  We are closed weekends and all major holidays.  You do have access to a nurse 24-7, just call the main number to the clinic [phone] and do not press any options, hold on the line and a  nurse will answer the phone.    For prescription refill requests, have your pharmacy contact our office and allow 72 hours.    Masks are no longer required in the cancer centers. If you would like for your care team to wear a mask while they are taking care of you, please let them know. You may have one support person who is at least 87 years old accompany you for your appointments.

## 2023-02-18 ENCOUNTER — Encounter: Payer: Self-pay | Admitting: General Surgery

## 2023-02-18 ENCOUNTER — Telehealth: Payer: Self-pay | Admitting: *Deleted

## 2023-02-18 DIAGNOSIS — K4090 Unilateral inguinal hernia, without obstruction or gangrene, not specified as recurrent: Secondary | ICD-10-CM

## 2023-02-18 MED ORDER — ENOXAPARIN SODIUM 120 MG/0.8ML IJ SOSY
1.5000 mg/kg | PREFILLED_SYRINGE | INTRAMUSCULAR | 0 refills | Status: DC
Start: 2023-02-18 — End: 2023-03-01

## 2023-02-18 NOTE — Telephone Encounter (Signed)
Dr. Algis Greenhouse: Spoke with Dr. Ellin Saba regarding recurrent DVT risk and any need for bridging. He reports the last DVT was in 2019. Plan to hold warfarin 5 days and bridge with Lovenox 1.5 mg/kg once daily dose until the day before surgery. So patient's last day of Warfarin will be 4/30. He will then not take it (warfarin) until after surgery when we tell him to resume it. He will take a lovenox shot daily that has been sent in to [facility]. This will start 5/1-5/5. He will not take anything the day of surgery.

## 2023-02-18 NOTE — H&P (Signed)
[facility] History and Physical  Reason for Referral: Right inguinal hernia  Referring Physician: Toma Deiters, MD   Chief Complaint   New Patient (Initial Visit)     Dale Suarez is a 89 y.o. male.  HPI:  Dale Suarez is a very sweet man who is active and still drives and does is housework and activities of daily living. He comes in with a few weeks of having a bulge in the right groin and some burning pain. He does not lift anything more than his laundry he reports or groceries. He is active and says he does not want this hernia to get worse.   Past Medical History:  Diagnosis Date   Anemia    Glaucoma    Hypertension    Smoldering myeloma     History reviewed. No pertinent surgical history.  Family History  Problem Relation Age of Onset   Hypertension Mother    Heart attack Mother    Heart attack Father    Stroke Sister    Alcoholism Brother    Cirrhosis Brother     Social History   Tobacco Use   Smoking status: Never   Smokeless tobacco: Never  Substance Use Topics   Alcohol use: No   Drug use: No    Medications: I have reviewed the patient's current medications. Allergies as of 02/15/2023   No Known Allergies      Medication List        Accurate as of February 15, 2023 11:59 PM. If you have any questions, ask your nurse or doctor.          albuterol 108 (90 Base) MCG/ACT inhaler Commonly known as: VENTOLIN HFA Inhale into the lungs.   cyanocobalamin 500 MCG tablet Commonly known as: VITAMIN B12 Take 500 mcg by mouth daily.   dorzolamide 2 % ophthalmic solution Commonly known as: TRUSOPT INSTILL ONE DROP IN EACH EYE TWICE DAILY   doxazosin 8 MG tablet Commonly known as: CARDURA Take 8 mg by mouth daily.   enoxaparin 120 MG/0.8ML injection Commonly known as: LOVENOX Inject 0.78 mLs (117 mg total) into the skin daily for 5 days. Started by: Lucretia Roers, MD   Glucosamine Sulfate 1000 MG Caps Take by mouth.  Takes twice a day   hydrochlorothiazide 50 MG tablet Commonly known as: HYDRODIURIL Take 50 mg by mouth daily.   IRON 27 PO Take 1 tablet every morning by mouth.   latanoprost 0.005 % ophthalmic solution Commonly known as: XALATAN Place 1 drop into both eyes at bedtime.   loratadine 10 MG tablet Commonly known as: CLARITIN Take 10 mg by mouth daily.   sodium bicarbonate 650 MG tablet Take 650 mg by mouth daily.   Vitamin D3 50 MCG (2000 UT) Tabs Take 2,000 Units by mouth daily.   warfarin 5 MG tablet Commonly known as: COUMADIN TAKE 1 TABLET BY MOUTH EVERY OTHER DAY ALTERNATING WITH ONE & 1/2 TABLETS EVERY OTHER DAY         ROS:  A comprehensive review of systems was negative except for: Respiratory: positive for SOB uses albuterol Genitourinary: positive for frequency Hematologic/lymphatic: positive for DVT, smoldering myeloma   Blood pressure 130/67, pulse 65, temperature 98.2 F (36.8 C), temperature source Other (Comment), resp. rate 12, height 5\' 11"  (1.803 m), weight 174 lb (78.9 kg), SpO2 99 %. Physical Exam Vitals reviewed.  HENT:     Head: Normocephalic.     Nose: Nose normal.  Mouth/Throat:     Mouth: Mucous membranes are moist.  Eyes:     Extraocular Movements: Extraocular movements intact.  Cardiovascular:     Rate and Rhythm: Normal rate and regular rhythm.  Pulmonary:     Effort: Pulmonary effort is normal.     Breath sounds: Normal breath sounds.  Abdominal:     General: There is no distension.     Palpations: Abdomen is soft.     Tenderness: There is no abdominal tenderness.     Hernia: A hernia is present. Hernia is present in the right inguinal area. There is no hernia in the left inguinal area.     Comments: Reducible right inguinal hernia   Musculoskeletal:        General: Normal range of motion.     Cervical back: Normal range of motion.  Skin:    General: Skin is warm.  Neurological:     General: No focal deficit present.      Mental Status: He is alert and oriented to person, place, and time.  Psychiatric:        Mood and Affect: Mood normal.        Behavior: Behavior normal.        Thought Content: Thought content normal.        Judgment: Judgment normal.     Results: None    Assessment & Plan:  Dale Suarez is a 89 y.o. male with right inguinal hernia.   Discussed the risk and benefits including, bleeding, infection, use of mesh, risk of recurrence, risk of nerve damage causing numbness or changes in sensation, risk of damage to the cord structures. The patient understands the risk and benefits of repair with mesh, and has decided to proceed.  We also discussed open versus robotic assisted laparoscopic surgery and the use of mesh. We discussed that I do both robotic and open repairs with mesh, and that these are considered equivalent. We discussed reasons for opting for laparoscopic surgery including if a bilateral repair is needed or if a patient has a recurrence after an open repair. We discussed the option of watch and wait in men and discussed that in 5 years some studies report that 40% of men have crossed over to needing a hernia repair because the hernia has become larger or symptomatic. We discussed that women are not appropriate candidate for watchful waiting due to the risk of femoral hernias.   Plan for open right hernia repair with mesh given his age and longer time under anesthesia with the robotic hernia.   He is on warfarin and I discussed this with Dr. Ellin Saba regarding recurrent DVT risk and any need for bridging. He reports the last DVT was in 2019. Plan to hold warfarin 5 days and bridge with Lovenox 1.5 mg/kg once daily dose until the day before surgery. So patient's last day of Warfarin will be 4/30. He will then not take it until after surgery when we tell him to resume it. He will take a lovenox shot daily that has been sent in to [facility]. This will start 5/1-5/5. He will not take  anything the day of surgery.   My office will update him on this plan.   All questions were answered to the satisfaction of the patient.    Lucretia Roers 02/18/2023, 7:55 AM

## 2023-02-18 NOTE — Telephone Encounter (Signed)
Call placed to patient and patient made aware.  

## 2023-02-22 NOTE — Patient Instructions (Signed)
Dale Suarez  02/22/2023     @PREFPERIOPPHARMACY @   Your procedure is scheduled on  02/28/2023.   Report to [facility] at  0600  A.M.   Call this number if you have problems the morning of surgery:  [phone]  If you experience any cold or flu symptoms such as cough, fever, chills, shortness of breath, etc. between now and your scheduled surgery, please notify us at the above number.   Remember:  Do not eat or drink after midnight.       Your last dose of coumadin should be on 4/30.  Your lovenox bridge should be 5/1-02/27/2023.     Take these medicines the morning of surgery with A SIP OF WATER                                   Cardura, claritin.    Do not wear jewelry, make-up or nail polish.  Do not wear lotions, powders, or perfumes, or deodorant.  Do not shave 48 hours prior to surgery.  Men may shave face and neck.  Do not bring valuables to the hospital.  [facility] is not responsible for any belongings or valuables.  Contacts, dentures or bridgework may not be worn into surgery.  Leave your suitcase in the car.  After surgery it may be brought to your room.  For patients admitted to the hospital, discharge time will be determined by your treatment team.  Patients discharged the day of surgery will not be allowed to drive home and must have someone with htem for 24 hours.    Special instructions:   DO NOT smoke tobacco or vape for 24 hours before your procedure.  Please read over the following fact sheets that you were given. Coughing and Deep Breathing, Surgical Site Infection Prevention, Anesthesia Post-op Instructions, and Care and Recovery After Surgery      Open Hernia Repair, Adult, Care After What can I expect after the procedure? After the procedure, it is common to have: Mild discomfort. Slight bruising. Mild swelling. Pain in the belly (abdomen). A small amount of blood from the cut from surgery (incision). Follow these  instructions at home: Your doctor may give you more specific instructions. If you have problems, call your doctor. Medicines Take over-the-counter and prescription medicines only as told by your doctor. If told, take steps to prevent problems with pooping (constipation). You may need to: Drink enough fluid to keep your pee (urine) pale yellow. Take medicines. You will be told what medicines to take. Eat foods that are high in fiber. These include beans, whole grains, and fresh fruits and vegetables. Limit foods that are high in fat and sugar. These include fried or sweet foods. Ask your doctor if you should avoid driving or using machines while you are taking your medicine. Incision care  Follow instructions from your doctor about how to take care of your incision. Make sure you: Wash your hands with soap and water for at least 20 seconds before and after you change your bandage (dressing). If you cannot use soap and water, use hand sanitizer. Change your bandage. Leave stitches or skin glue in place for at least 2 weeks. Leave tape strips alone unless you are told to take them off. You may trim the edges of the tape strips if they curl up. Check your incision every day for signs of  infection. Check for: More redness, swelling, or pain. More fluid or blood. Warmth. Pus or a bad smell. Wear loose, soft clothing while your incision heals. Activity  Rest as told by your doctor. Do not lift anything that is heavier than 10 lb (4.5 kg), or the limit that you are told. Do not play contact sports until your doctor says that this is safe. If you were given a sedative during your procedure, do not drive or use machines until your doctor says that it is safe. A sedative is a medicine that helps you relax. Return to your normal activities when your doctor says that it is safe. General instructions Do not take baths, swim, or use a hot tub. Ask your doctor about taking showers or sponge  baths. Hold a pillow over your belly when you cough or sneeze. This helps with pain. Do not smoke or use any products that contain nicotine or tobacco. If you need help quitting, ask your doctor. Keep all follow-up visits. Contact a doctor if: You have any of these signs of infection in or around your incision: More redness, swelling, or pain. More fluid or blood. Warmth. Pus. A bad smell. You have a fever or chills. You have blood in your poop (stool). You have not pooped (had a bowel movement) in 2-3 days. Medicine does not help your pain. Get help right away if: You have chest pain, or you are short of breath. You feel faint or light-headed. You have very bad pain. You vomit and your pain is worse. You have pain, swelling, or redness in a leg. These symptoms may be an emergency. Get help right away. Call your local emergency services (911 in the [place] Do not wait to see if the symptoms will go away. Do not drive yourself to the hospital. Summary After this procedure, it is common to have mild discomfort, slight bruising, and mild swelling. Follow instructions from your doctor about how to take care of your cut from surgery (incision). Check every day for signs of infection. Do not lift heavy objects or play contact sports until your doctor says it is safe. Return to your normal activities as told by your doctor. This information is not intended to replace advice given to you by your health care provider. Make sure you discuss any questions you have with your health care provider. Document Revised: 05/26/2020 Document Reviewed: 05/26/2020 Elsevier Patient Education  2023 Else[employer]. General Anesthesia, Adult, Care After The following information offers guidance on how to care for yourself after your procedure. Your health care provider may also give you more specific instructions. If you have problems or questions, contact your health care provider. What can I expect after the  procedure? After the procedure, it is common for people to: Have pain or discomfort at the IV site. Have nausea or vomiting. Have a sore throat or hoarseness. Have trouble concentrating. Feel cold or chills. Feel weak, sleepy, or tired (fatigue). Have soreness and body aches. These can affect parts of the body that were not involved in surgery. Follow these instructions at home: For the time period you were told by your health care provider:  Rest. Do not participate in activities where you could fall or become injured. Do not drive or use machinery. Do not drink alcohol. Do not take sleeping pills or medicines that cause drowsiness. Do not make important decisions or sign legal documents. Do not take care of children on your own. General instructions Drink enough fluid to  keep your urine pale yellow. If you have sleep apnea, surgery and certain medicines can increase your risk for breathing problems. Follow instructions from your health care provider about wearing your sleep device: Anytime you are sleeping, including during daytime naps. While taking prescription pain medicines, sleeping medicines, or medicines that make you drowsy. Return to your normal activities as told by your health care provider. Ask your health care provider what activities are safe for you. Take over-the-counter and prescription medicines only as told by your health care provider. Do not use any products that contain nicotine or tobacco. These products include cigarettes, chewing tobacco, and vaping devices, such as e-cigarettes. These can delay incision healing after surgery. If you need help quitting, ask your health care provider. Contact a health care provider if: You have nausea or vomiting that does not get better with medicine. You vomit every time you eat or drink. You have pain that does not get better with medicine. You cannot urinate or have bloody urine. You develop a skin rash. You have a  fever. Get help right away if: You have trouble breathing. You have chest pain. You vomit blood. These symptoms may be an emergency. Get help right away. Call 911. Do not wait to see if the symptoms will go away. Do not drive yourself to the hospital. Summary After the procedure, it is common to have a sore throat, hoarseness, nausea, vomiting, or to feel weak, sleepy, or fatigue. For the time period you were told by your health care provider, do not drive or use machinery. Get help right away if you have difficulty breathing, have chest pain, or vomit blood. These symptoms may be an emergency. This information is not intended to replace advice given to you by your health care provider. Make sure you discuss any questions you have with your health care provider. Document Revised: 01/08/2022 Document Reviewed: 01/08/2022 Elsevier Patient Education  2023 Else[employer] Inc. How to Use Chlorhexidine Before Surgery Chlorhexidine gluconate (CHG) is a germ-killing (antiseptic) solution that is used to clean the skin. It can get rid of the bacteria that normally live on the skin and can keep them away for about 24 hours. To clean your skin with CHG, you may be given: A CHG solution to use in the shower or as part of a sponge bath. A prepackaged cloth that contains CHG. Cleaning your skin with CHG may help lower the risk for infection: While you are staying in the intensive care unit of the hospital. If you have a vascular access, such as a central line, to provide short-term or long-term access to your veins. If you have a catheter to drain urine from your bladder. If you are on a ventilator. A ventilator is a machine that helps you breathe by moving air in and out of your lungs. After surgery. What are the risks? Risks of using CHG include: A skin reaction. Hearing loss, if CHG gets in your ears and you have a perforated eardrum. Eye injury, if CHG gets in your eyes and is not rinsed out. The CHG  product catching fire. Make sure that you avoid smoking and flames after applying CHG to your skin. Do not use CHG: If you have a chlorhexidine allergy or have previously reacted to chlorhexidine. On babies younger than 90 months of age. How to use CHG solution Use CHG only as told by your health care provider, and follow the instructions on the label. Use the full amount of CHG as directed. Usually,  this is one bottle. During a shower Follow these steps when using CHG solution during a shower (unless your health care provider gives you different instructions): Start the shower. Use your normal soap and shampoo to wash your face and hair. Turn off the shower or move out of the shower stream. Pour the CHG onto a clean washcloth. Do not use any type of brush or rough-edged sponge. Starting at your neck, lather your body down to your toes. Make sure you follow these instructions: If you will be having surgery, pay special attention to the part of your body where you will be having surgery. Scrub this area for at least 1 minute. Do not use CHG on your head or face. If the solution gets into your ears or eyes, rinse them well with water. Avoid your genital area. Avoid any areas of skin that have broken skin, cuts, or scrapes. Scrub your back and under your arms. Make sure to wash skin folds. Let the lather sit on your skin for 1-2 minutes or as long as told by your health care provider. Thoroughly rinse your entire body in the shower. Make sure that all body creases and crevices are rinsed well. Dry off with a clean towel. Do not put any substances on your body afterward--such as powder, lotion, or perfume--unless you are told to do so by your health care provider. Only use lotions that are recommended by the manufacturer. Put on clean clothes or pajamas. If it is the night before your surgery, sleep in clean sheets.  During a sponge bath Follow these steps when using CHG solution during a  sponge bath (unless your health care provider gives you different instructions): Use your normal soap and shampoo to wash your face and hair. Pour the CHG onto a clean washcloth. Starting at your neck, lather your body down to your toes. Make sure you follow these instructions: If you will be having surgery, pay special attention to the part of your body where you will be having surgery. Scrub this area for at least 1 minute. Do not use CHG on your head or face. If the solution gets into your ears or eyes, rinse them well with water. Avoid your genital area. Avoid any areas of skin that have broken skin, cuts, or scrapes. Scrub your back and under your arms. Make sure to wash skin folds. Let the lather sit on your skin for 1-2 minutes or as long as told by your health care provider. Using a different clean, wet washcloth, thoroughly rinse your entire body. Make sure that all body creases and crevices are rinsed well. Dry off with a clean towel. Do not put any substances on your body afterward--such as powder, lotion, or perfume--unless you are told to do so by your health care provider. Only use lotions that are recommended by the manufacturer. Put on clean clothes or pajamas. If it is the night before your surgery, sleep in clean sheets. How to use CHG prepackaged cloths Only use CHG cloths as told by your health care provider, and follow the instructions on the label. Use the CHG cloth on clean, dry skin. Do not use the CHG cloth on your head or face unless your health care provider tells you to. When washing with the CHG cloth: Avoid your genital area. Avoid any areas of skin that have broken skin, cuts, or scrapes. Before surgery Follow these steps when using a CHG cloth to clean before surgery (unless your health care provider gives  you different instructions): Using the CHG cloth, vigorously scrub the part of your body where you will be having surgery. Scrub using a back-and-forth motion  for 3 minutes. The area on your body should be completely wet with CHG when you are done scrubbing. Do not rinse. Discard the cloth and let the area air-dry. Do not put any substances on the area afterward, such as powder, lotion, or perfume. Put on clean clothes or pajamas. If it is the night before your surgery, sleep in clean sheets.  For general bathing Follow these steps when using CHG cloths for general bathing (unless your health care provider gives you different instructions). Use a separate CHG cloth for each area of your body. Make sure you wash between any folds of skin and between your fingers and toes. Wash your body in the following order, switching to a new cloth after each step: The front of your neck, shoulders, and chest. Both of your arms, under your arms, and your hands. Your stomach and groin area, avoiding the genitals. Your right leg and foot. Your left leg and foot. The back of your neck, your back, and your buttocks. Do not rinse. Discard the cloth and let the area air-dry. Do not put any substances on your body afterward--such as powder, lotion, or perfume--unless you are told to do so by your health care provider. Only use lotions that are recommended by the manufacturer. Put on clean clothes or pajamas. Contact a health care provider if: Your skin gets irritated after scrubbing. You have questions about using your solution or cloth. You swallow any chlorhexidine. Call your local poison control center [phone])801-2442 in the [place] Get help right away if: Your eyes itch badly, or they become very red or swollen. Your skin itches badly and is red or swollen. Your hearing changes. You have trouble seeing. You have swelling or tingling in your mouth or throat. You have trouble breathing. These symptoms may represent a serious problem that is an emergency. Do not wait to see if the symptoms will go away. Get medical help right away. Call your local emergency services  (911 in the [place] Do not drive yourself to the hospital. Summary Chlorhexidine gluconate (CHG) is a germ-killing (antiseptic) solution that is used to clean the skin. Cleaning your skin with CHG may help to lower your risk for infection. You may be given CHG to use for bathing. It may be in a bottle or in a prepackaged cloth to use on your skin. Carefully follow your health care provider's instructions and the instructions on the product label. Do not use CHG if you have a chlorhexidine allergy. Contact your health care provider if your skin gets irritated after scrubbing. This information is not intended to replace advice given to you by your health care provider. Make sure you discuss any questions you have with your health care provider. Document Revised: 02/08/2022 Document Reviewed: 12/22/2020 Elsevier Patient Education  2023 [employer].

## 2023-02-24 ENCOUNTER — Encounter (HOSPITAL_COMMUNITY): Payer: Self-pay

## 2023-02-24 ENCOUNTER — Encounter (HOSPITAL_COMMUNITY)
Admission: RE | Admit: 2023-02-24 | Discharge: 2023-02-24 | Disposition: A | Discharge status: Home / Self Care | Payer: Medicare FFS | Source: Ambulatory Visit | Attending: General Surgery | Admitting: General Surgery

## 2023-02-24 VITALS — BP 138/74 | HR 73 | Temp 97.6°F | Resp 18 | Ht 71.0 in | Wt 171.6 lb

## 2023-02-24 DIAGNOSIS — Z01818 Encounter for other preprocedural examination: Secondary | ICD-10-CM | POA: Diagnosis present

## 2023-02-24 DIAGNOSIS — K4090 Unilateral inguinal hernia, without obstruction or gangrene, not specified as recurrent: Secondary | ICD-10-CM

## 2023-02-24 DIAGNOSIS — Z86718 Personal history of other venous thrombosis and embolism: Secondary | ICD-10-CM | POA: Diagnosis not present

## 2023-02-24 DIAGNOSIS — Z7901 Long term (current) use of anticoagulants: Secondary | ICD-10-CM | POA: Diagnosis not present

## 2023-02-24 DIAGNOSIS — I10 Essential (primary) hypertension: Secondary | ICD-10-CM

## 2023-02-24 LAB — BASIC METABOLIC PANEL
Anion gap: 4 — ABNORMAL LOW (ref 5–15)
BUN: 24 mg/dL — ABNORMAL HIGH (ref 8–23)
CO2: 23 mmol/L (ref 22–32)
Calcium: 8.8 mg/dL — ABNORMAL LOW (ref 8.9–10.3)
Chloride: 101 mmol/L (ref 98–111)
Creatinine, Ser: 1.64 mg/dL — ABNORMAL HIGH (ref 0.61–1.24)
GFR, Estimated: 39 mL/min — ABNORMAL LOW (ref >60)
Glucose, Bld: 79 mg/dL (ref 70–99)
Potassium: 3.6 mmol/L (ref 3.5–5.1)
Sodium: 128 mmol/L — ABNORMAL LOW (ref 135–145)

## 2023-02-24 LAB — CBC WITH DIFFERENTIAL/PLATELET
Abs Immature Granulocytes: 0 10*3/uL (ref 0.00–0.07)
Basophils Absolute: 0 10*3/uL (ref 0.0–0.1)
Basophils Relative: 1 %
Eosinophils Absolute: 0.1 10*3/uL (ref 0.0–0.5)
Eosinophils Relative: 2 %
HCT: 34.8 % — ABNORMAL LOW (ref 39.0–52.0)
Hemoglobin: 11.3 g/dL — ABNORMAL LOW (ref 13.0–17.0)
Immature Granulocytes: 0 %
Lymphocytes Relative: 31 %
Lymphs Abs: 0.7 10*3/uL (ref 0.7–4.0)
MCH: 33.3 pg (ref 26.0–34.0)
MCHC: 32.5 g/dL (ref 30.0–36.0)
MCV: 102.7 fL — ABNORMAL HIGH (ref 80.0–100.0)
Monocytes Absolute: 0.4 10*3/uL (ref 0.1–1.0)
Monocytes Relative: 18 %
Neutro Abs: 1.1 10*3/uL — ABNORMAL LOW (ref 1.7–7.7)
Neutrophils Relative %: 48 %
Platelets: 153 10*3/uL (ref 150–400)
RBC: 3.39 MIL/uL — ABNORMAL LOW (ref 4.22–5.81)
RDW: 14.9 % (ref 11.5–15.5)
WBC: 2.4 10*3/uL — ABNORMAL LOW (ref 4.0–10.5)
nRBC: 0 % (ref 0.0–0.2)

## 2023-02-24 LAB — PROTIME-INR
INR: 2.1 — ABNORMAL HIGH (ref 0.8–1.2)
Prothrombin Time: 23.6 seconds — ABNORMAL HIGH (ref 11.4–15.2)

## 2023-02-24 NOTE — Pre-Procedure Instructions (Signed)
Left VM for patient that his arrival time has changed to 0630.

## 2023-02-24 NOTE — Progress Notes (Signed)
Will need a repeat INR check on Monday before surgery stat. I think we probably need to do the cholecystectomy first and him second.

## 2023-02-28 ENCOUNTER — Other Ambulatory Visit: Payer: Self-pay

## 2023-02-28 ENCOUNTER — Encounter (HOSPITAL_COMMUNITY)
Admission: RE | Disposition: A | Discharge status: Home / Self Care | Payer: Self-pay | Source: Home / Self Care | Attending: General Surgery

## 2023-02-28 ENCOUNTER — Observation Stay (HOSPITAL_COMMUNITY)
Admission: RE | Admit: 2023-02-28 | Discharge: 2023-03-01 | Disposition: A | Discharge status: Home / Self Care | Payer: Medicare FFS | Attending: General Surgery | Admitting: General Surgery

## 2023-02-28 ENCOUNTER — Ambulatory Visit (HOSPITAL_COMMUNITY): Payer: Medicare FFS | Admitting: Certified Registered Nurse Anesthetist

## 2023-02-28 ENCOUNTER — Ambulatory Visit (HOSPITAL_BASED_OUTPATIENT_CLINIC_OR_DEPARTMENT_OTHER): Payer: Medicare FFS | Admitting: Certified Registered Nurse Anesthetist

## 2023-02-28 DIAGNOSIS — I10 Essential (primary) hypertension: Secondary | ICD-10-CM | POA: Diagnosis not present

## 2023-02-28 DIAGNOSIS — Z7901 Long term (current) use of anticoagulants: Secondary | ICD-10-CM | POA: Insufficient documentation

## 2023-02-28 DIAGNOSIS — D649 Anemia, unspecified: Secondary | ICD-10-CM | POA: Insufficient documentation

## 2023-02-28 DIAGNOSIS — K4090 Unilateral inguinal hernia, without obstruction or gangrene, not specified as recurrent: Secondary | ICD-10-CM | POA: Diagnosis present

## 2023-02-28 DIAGNOSIS — Z86718 Personal history of other venous thrombosis and embolism: Secondary | ICD-10-CM

## 2023-02-28 HISTORY — PX: INGUINAL HERNIA REPAIR: SHX194

## 2023-02-28 LAB — PROTIME-INR
INR: 1.4 — ABNORMAL HIGH (ref 0.8–1.2)
Prothrombin Time: 16.9 seconds — ABNORMAL HIGH (ref 11.4–15.2)

## 2023-02-28 SURGERY — REPAIR, HERNIA, INGUINAL, ADULT
Anesthesia: General | Site: Inguinal | Laterality: Right

## 2023-02-28 MED ORDER — LORATADINE 10 MG PO TABS
10.0000 mg | ORAL_TABLET | Freq: Every day | ORAL | Status: DC
  Administered 2023-03-01: 10 mg via ORAL
  Filled 2023-02-28 (×2): qty 1

## 2023-02-28 MED ORDER — PANTOPRAZOLE SODIUM 40 MG IV SOLR
40.0000 mg | Freq: Every day | INTRAVENOUS | Status: DC
  Administered 2023-02-28: 40 mg via INTRAVENOUS
  Filled 2023-02-28: qty 10

## 2023-02-28 MED ORDER — OXYCODONE HCL 5 MG/5ML PO SOLN: 5.0000 mg | Freq: Once | ORAL | Status: DC | PRN

## 2023-02-28 MED ORDER — METOPROLOL TARTRATE 5 MG/5ML IV SOLN: 5.0000 mg | Freq: Four times a day (QID) | INTRAVENOUS | Status: DC | PRN

## 2023-02-28 MED ORDER — FENTANYL CITRATE (PF) 100 MCG/2ML IJ SOLN
INTRAMUSCULAR | Status: DC | PRN
  Administered 2023-02-28: 100 ug via INTRAVENOUS

## 2023-02-28 MED ORDER — ALBUTEROL SULFATE HFA 108 (90 BASE) MCG/ACT IN AERS: 1.0000 | INHALATION_SPRAY | RESPIRATORY_TRACT | Status: DC | PRN

## 2023-02-28 MED ORDER — LIDOCAINE HCL (PF) 2 % IJ SOLN
INTRAMUSCULAR | Status: AC
  Filled 2023-02-28: qty 5

## 2023-02-28 MED ORDER — ONDANSETRON HCL 4 MG/2ML IJ SOLN
INTRAMUSCULAR | Status: AC
  Filled 2023-02-28: qty 2

## 2023-02-28 MED ORDER — BUPIVACAINE HCL (PF) 0.5 % IJ SOLN
INTRAMUSCULAR | Status: DC | PRN
  Administered 2023-02-28: 30 mL

## 2023-02-28 MED ORDER — LATANOPROST 0.005 % OP SOLN: 1.0000 [drp] | Freq: Every day | OPHTHALMIC | Status: DC

## 2023-02-28 MED ORDER — PROPOFOL 10 MG/ML IV BOLUS
INTRAVENOUS | Status: AC
  Filled 2023-02-28: qty 20

## 2023-02-28 MED ORDER — SODIUM CHLORIDE 0.9 % IR SOLN
Status: DC | PRN
  Administered 2023-02-28: 1000 mL

## 2023-02-28 MED ORDER — ORAL CARE MOUTH RINSE: 15.0000 mL | Freq: Once | OROMUCOSAL | Status: AC

## 2023-02-28 MED ORDER — ACETAMINOPHEN 500 MG PO TABS
1000.0000 mg | ORAL_TABLET | Freq: Four times a day (QID) | ORAL | Status: DC
  Administered 2023-02-28 – 2023-03-01 (×4): 1000 mg via ORAL
  Filled 2023-02-28 (×4): qty 2

## 2023-02-28 MED ORDER — ONDANSETRON 4 MG PO TBDP: 4.0000 mg | ORAL_TABLET | Freq: Four times a day (QID) | ORAL | Status: DC | PRN

## 2023-02-28 MED ORDER — LACTATED RINGERS IV SOLN: INTRAVENOUS | Status: DC

## 2023-02-28 MED ORDER — ONDANSETRON HCL 4 MG/2ML IJ SOLN
INTRAMUSCULAR | Status: DC | PRN
  Administered 2023-02-28: 4 mg via INTRAVENOUS

## 2023-02-28 MED ORDER — ENOXAPARIN SODIUM 40 MG/0.4ML IJ SOSY
40.0000 mg | PREFILLED_SYRINGE | INTRAMUSCULAR | Status: DC
  Administered 2023-03-01: 40 mg via SUBCUTANEOUS
  Filled 2023-02-28: qty 0.4

## 2023-02-28 MED ORDER — CHLORHEXIDINE GLUCONATE CLOTH 2 % EX PADS: 6.0000 | MEDICATED_PAD | Freq: Once | CUTANEOUS | Status: DC

## 2023-02-28 MED ORDER — PROPOFOL 10 MG/ML IV BOLUS
INTRAVENOUS | Status: DC | PRN
  Administered 2023-02-28: 50 mg via INTRAVENOUS

## 2023-02-28 MED ORDER — ONDANSETRON HCL 4 MG/2ML IJ SOLN: 4.0000 mg | Freq: Once | INTRAMUSCULAR | Status: DC | PRN

## 2023-02-28 MED ORDER — PHENYLEPHRINE 80 MCG/ML (10ML) SYRINGE FOR IV PUSH (FOR BLOOD PRESSURE SUPPORT)
PREFILLED_SYRINGE | INTRAVENOUS | Status: AC
  Filled 2023-02-28: qty 10

## 2023-02-28 MED ORDER — DOCUSATE SODIUM 100 MG PO CAPS
100.0000 mg | ORAL_CAPSULE | Freq: Two times a day (BID) | ORAL | Status: DC
  Administered 2023-03-01: 100 mg via ORAL
  Filled 2023-02-28: qty 1

## 2023-02-28 MED ORDER — LACTATED RINGERS IV SOLN: INTRAVENOUS | Status: DC | PRN

## 2023-02-28 MED ORDER — BUPIVACAINE HCL (PF) 0.5 % IJ SOLN
INTRAMUSCULAR | Status: AC
  Filled 2023-02-28: qty 30

## 2023-02-28 MED ORDER — ROCURONIUM BROMIDE 10 MG/ML (PF) SYRINGE
PREFILLED_SYRINGE | INTRAVENOUS | Status: AC
  Filled 2023-02-28: qty 10

## 2023-02-28 MED ORDER — MELATONIN 3 MG PO TABS: 3.0000 mg | ORAL_TABLET | Freq: Every evening | ORAL | Status: DC | PRN

## 2023-02-28 MED ORDER — OXYCODONE HCL 5 MG PO TABS: 5.0000 mg | ORAL_TABLET | ORAL | Status: DC | PRN

## 2023-02-28 MED ORDER — PHENYLEPHRINE HCL (PRESSORS) 10 MG/ML IV SOLN
INTRAVENOUS | Status: DC | PRN
  Administered 2023-02-28: 80 ug via INTRAVENOUS

## 2023-02-28 MED ORDER — ALBUTEROL SULFATE (2.5 MG/3ML) 0.083% IN NEBU: 2.5000 mg | INHALATION_SOLUTION | RESPIRATORY_TRACT | Status: DC | PRN

## 2023-02-28 MED ORDER — DORZOLAMIDE HCL 2 % OP SOLN
1.0000 [drp] | Freq: Two times a day (BID) | OPHTHALMIC | Status: DC
  Administered 2023-03-01: 1 [drp] via OPHTHALMIC
  Filled 2023-02-28: qty 10

## 2023-02-28 MED ORDER — FENTANYL CITRATE PF 50 MCG/ML IJ SOSY: 25.0000 ug | PREFILLED_SYRINGE | INTRAMUSCULAR | Status: DC | PRN

## 2023-02-28 MED ORDER — FENTANYL CITRATE (PF) 250 MCG/5ML IJ SOLN
INTRAMUSCULAR | Status: AC
  Filled 2023-02-28: qty 5

## 2023-02-28 MED ORDER — CHLORHEXIDINE GLUCONATE 0.12 % MT SOLN
OROMUCOSAL | Status: AC
  Filled 2023-02-28: qty 15

## 2023-02-28 MED ORDER — SUGAMMADEX SODIUM 200 MG/2ML IV SOLN
INTRAVENOUS | Status: DC | PRN
  Administered 2023-02-28: 155.6 mg via INTRAVENOUS

## 2023-02-28 MED ORDER — CHLORHEXIDINE GLUCONATE 0.12 % MT SOLN
15.0000 mL | Freq: Once | OROMUCOSAL | Status: AC
  Administered 2023-02-28: 15 mL via OROMUCOSAL
  Filled 2023-02-28: qty 15

## 2023-02-28 MED ORDER — ROCURONIUM BROMIDE 100 MG/10ML IV SOLN
INTRAVENOUS | Status: DC | PRN
  Administered 2023-02-28: 50 mg via INTRAVENOUS

## 2023-02-28 MED ORDER — SIMETHICONE 80 MG PO CHEW: 40.0000 mg | CHEWABLE_TABLET | Freq: Four times a day (QID) | ORAL | Status: DC | PRN

## 2023-02-28 MED ORDER — DIPHENHYDRAMINE HCL 50 MG/ML IJ SOLN: 12.5000 mg | Freq: Four times a day (QID) | INTRAMUSCULAR | Status: DC | PRN

## 2023-02-28 MED ORDER — ONDANSETRON HCL 4 MG/2ML IJ SOLN: 4.0000 mg | Freq: Four times a day (QID) | INTRAMUSCULAR | Status: DC | PRN

## 2023-02-28 MED ORDER — OXYCODONE HCL 5 MG PO TABS: 5.0000 mg | ORAL_TABLET | Freq: Once | ORAL | Status: DC | PRN

## 2023-02-28 MED ORDER — HYDROCHLOROTHIAZIDE 25 MG PO TABS
50.0000 mg | ORAL_TABLET | Freq: Every day | ORAL | Status: DC
  Administered 2023-03-01: 50 mg via ORAL
  Filled 2023-02-28: qty 2

## 2023-02-28 MED ORDER — DOXAZOSIN MESYLATE 2 MG PO TABS
8.0000 mg | ORAL_TABLET | Freq: Every day | ORAL | Status: DC
  Administered 2023-02-28: 8 mg via ORAL
  Filled 2023-02-28: qty 4

## 2023-02-28 MED ORDER — CEFAZOLIN SODIUM-DEXTROSE 2-4 GM/100ML-% IV SOLN
2.0000 g | INTRAVENOUS | Status: AC
  Administered 2023-02-28: 2 g via INTRAVENOUS
  Filled 2023-02-28: qty 100

## 2023-02-28 MED ORDER — MORPHINE SULFATE (PF) 2 MG/ML IV SOLN: 2.0000 mg | INTRAVENOUS | Status: DC | PRN

## 2023-02-28 MED ORDER — DIPHENHYDRAMINE HCL 12.5 MG/5ML PO ELIX: 12.5000 mg | ORAL_SOLUTION | Freq: Four times a day (QID) | ORAL | Status: DC | PRN

## 2023-02-28 SURGICAL SUPPLY — 34 items
ADH SKN CLS APL DERMABOND .7 (GAUZE/BANDAGES/DRESSINGS) ×1
CLOTH BEACON ORANGE TIMEOUT ST (SAFETY) ×1 IMPLANT
COVER LIGHT HANDLE STERIS (MISCELLANEOUS) ×2 IMPLANT
DERMABOND ADVANCED .7 DNX12 (GAUZE/BANDAGES/DRESSINGS) ×1 IMPLANT
DRAIN PENROSE 0.5X18 (DRAIN) ×1 IMPLANT
ELECT REM PT RETURN 9FT ADLT (ELECTROSURGICAL) ×1
ELECTRODE REM PT RTRN 9FT ADLT (ELECTROSURGICAL) ×1 IMPLANT
GAUZE SPONGE 4X4 12PLY STRL (GAUZE/BANDAGES/DRESSINGS) ×1 IMPLANT
GLOVE BIO SURGEON STRL SZ 6.5 (GLOVE) ×1 IMPLANT
GLOVE BIOGEL M 6.5 STRL (GLOVE) IMPLANT
GLOVE BIOGEL PI IND STRL 6.5 (GLOVE) ×1 IMPLANT
GLOVE BIOGEL PI IND STRL 7.0 (GLOVE) ×2 IMPLANT
GOWN STRL REUS W/TWL LRG LVL3 (GOWN DISPOSABLE) ×3 IMPLANT
INST SET MINOR GENERAL (KITS) ×1 IMPLANT
KIT TURNOVER KIT A (KITS) ×1 IMPLANT
MANIFOLD NEPTUNE II (INSTRUMENTS) ×1 IMPLANT
MESH HERNIA 1.6X1.9 PLUG LRG (Mesh General) IMPLANT
NDL HYPO 18GX1.5 BLUNT FILL (NEEDLE) ×1 IMPLANT
NEEDLE HYPO 18GX1.5 BLUNT FILL (NEEDLE) ×1 IMPLANT
NS IRRIG 1000ML POUR BTL (IV SOLUTION) ×1 IMPLANT
PACK MINOR (CUSTOM PROCEDURE TRAY) ×1 IMPLANT
PAD ARMBOARD 7.5X6 YLW CONV (MISCELLANEOUS) ×1 IMPLANT
SET BASIN LINEN APH (SET/KITS/TRAYS/PACK) ×1 IMPLANT
SOL PREP PROV IODINE SCRUB 4OZ (MISCELLANEOUS) ×1 IMPLANT
SUT MNCRL AB 4-0 PS2 18 (SUTURE) ×1 IMPLANT
SUT NOVA NAB GS-22 2 2-0 T-19 (SUTURE) IMPLANT
SUT SILK 3 0 (SUTURE)
SUT SILK 3-0 18XBRD TIE 12 (SUTURE) IMPLANT
SUT VIC AB 2-0 CT1 27 (SUTURE) ×1
SUT VIC AB 2-0 CT1 TAPERPNT 27 (SUTURE) ×1 IMPLANT
SUT VIC AB 3-0 SH 27 (SUTURE) ×1
SUT VIC AB 3-0 SH 27X BRD (SUTURE) ×1 IMPLANT
SUT VICRYL AB 3 0 TIES (SUTURE) IMPLANT
SYR 30ML LL (SYRINGE) ×1 IMPLANT

## 2023-02-28 NOTE — Anesthesia Procedure Notes (Signed)
Procedure Name: Intubation Date/Time: 02/28/2023 7:47 AM  Performed by: Cy Blamer, CRNAPre-anesthesia Checklist: Patient identified, Emergency Drugs available, Suction available and Patient being monitored Patient Re-evaluated:Patient Re-evaluated prior to induction Oxygen Delivery Method: Circle system utilized Preoxygenation: Pre-oxygenation with 100% oxygen Induction Type: IV induction Ventilation: Mask ventilation without difficulty Laryngoscope Size: Miller and 2 Grade View: Grade I Tube type: Oral Tube size: 7.5 mm Number of attempts: 1 Airway Equipment and Method: Stylet Placement Confirmation: ETT inserted through vocal cords under direct vision, positive ETCO2 and breath sounds checked- equal and bilateral Secured at: 23 cm Tube secured with: Tape Dental Injury: Teeth and Oropharynx as per pre-operative assessment

## 2023-02-28 NOTE — Progress Notes (Signed)
[facility]  Patient did not want me to call any family after surgery. Says his cousin works here and will see him later. Admit overnight for monitoring.  Algis Greenhouse, MD [facility] [address] [place] [place], [place] [address] [phone] (office)

## 2023-02-28 NOTE — Transfer of Care (Signed)
Immediate Anesthesia Transfer of Care Note  Patient: Dale Suarez  Procedure(s) Performed: HERNIA REPAIR INGUINAL ADULT W/ MESH (Right: Inguinal)  Patient Location: PACU  Anesthesia Type:General  Level of Consciousness: awake, alert , and oriented  Airway & Oxygen Therapy: Patient Spontanous Breathing and Patient connected to nasal cannula oxygen  Post-op Assessment: Report given to RN, Post -op Vital signs reviewed and stable, Patient moving all extremities X 4, and Patient able to stick tongue midline  Post vital signs: Reviewed  Last Vitals:  Vitals Value Taken Time  BP 142/74   Temp 97.4   Pulse 66 02/28/23 1053  Resp 13 02/28/23 1053  SpO2 99 % 02/28/23 1053  Vitals shown include unvalidated device data.  Last Pain:  Vitals:   02/28/23 0809  PainSc: 0-No pain         Complications: No notable events documented.

## 2023-02-28 NOTE — Interval H&P Note (Signed)
History and Physical Interval Note:  02/28/2023 9:07 AM  Dale Suarez  has presented today for surgery, with the diagnosis of INGUINAL HERNIA, RIGHT.  The various methods of treatment have been discussed with the patient and family. After consideration of risks, benefits and other options for treatment, the patient has consented to  Procedure(s): HERNIA REPAIR INGUINAL ADULT W/ MESH (Right) as a surgical intervention.  The patient's history has been reviewed, patient examined, no change in status, stable for surgery.  I have reviewed the patient's chart and labs.  Questions were answered to the patient's satisfaction.     Lucretia Roers

## 2023-02-28 NOTE — Op Note (Signed)
[facility] Operative Note  02/28/23  Preoperative Diagnosis: Right inguinal hernia    Postoperative Diagnosis: Same   Procedure(s) Performed: Right inguinal hernia repair with mesh   Surgeon: Leatrice Jewels. Henreitta Leber, MD   Assistants: No qualified resident was available   Anesthesia: General endotracheal   Anesthesiologist: Dr. Johnnette Litter, MD   Specimens: None    Estimated Blood Loss: Minimal   Blood Replacement: None    Complications: None   Wound Class: Clean    Operative Indications: Mr. Trotti is a 89 yo with a right inguinal hernia that is causing him some discomfort. We discussed repair with mesh and discussed risk of bleeding, infection, use of mesh, risk if neurovascular injury or cord injury.  He was on coumadin for recurrent DVTs and we bridged him with lovenox.    Findings:Indirect inguinal hernia    Procedure: The patient was taken to the operating room and placed supine. General endotracheal anesthesia was induced. Intravenous antibiotics were  administered per protocol.  A time out was preformed verifying the correct patient, procedure, site, positioning and implants.  The right  groin and scrotum were prepared and draped in the usual sterile fashion.   An incision was made in a natural skin crease between the pubic tubercle and the anterior superior iliac spine.  The incision was deepened with electrocautery through Scarpa's and Camper's fascia until the aponeurosis of the external oblique was encountered.  This was cleaned and the external ring was exposed.  An incision was made in the midportion of the external oblique aponeurosis in the direction of its fibers. The ilioinguinal nerve was identified and was protected throughout the dissection.  Flaps of the external oblique were developed cephalad and inferiorly.    The cord was identified and it was gently dissented free at the pubic tubercle and encircled with a Penrose drain.  Attention was then  directed at the anteromedial aspect of the cord, where an indirect hernia sac was identified.  The sac was carefully dissected free from the cord down to the level of the internal ring.  The vas and testicular vessels were identified and protected from harm.  Once the sac was dissected free from the cords, the Penrose was placed around the cord which was retracted inferiorly out of the field of view.  The hernia was reduced into the internal ring without difficulty.  A large Perfix Plug was placed into the defect and filled the space.  Attention was then turned to the floor of the canal, which was grossly weakened without any defined defect or sac.  The Perfix Mesh Patch was sutured to the inguinal ligament inferiorly starting at the pubic tubercle using 2-0 Novafil interrupted sutures.  The mesh was sutured superiorly to the conjoint tendon using 2-0 Novafil interrupted sutures.  Care was taken to ensure the mesh was placed in a relaxed fashion to avoid excessive tension and no neurovascular structures were caught in the repair.  Laterally the tails of the mesh were crossed and the internal ring was recreated, allowing for passage of cords without tension.   Hemostasis was adequate.  The Penrose was removed.  The external oblique aponeurosis was closed with a 2-0 Vicryl suture in a running fashion, taking care to not catch the ilioinguinal nerve in the suture line.  Scarpa's fashion was closed with 3-0 Vicryl interrupted sutures. The skin was closed with a subcuticular 4-0 Monocryl suture.   Bupivacaine was injected into the incision and a regional block was performed. Dermabond  was applied.   The testis was gently pulled down into its anatomic position in the scrotum.  The patient tolerated the procedure well and was taken to the PACU in stable condition. All counts were correct at the end of the case.        Algis Greenhouse, MD [facility] [address] [place], [place]  [phone] (office)

## 2023-02-28 NOTE — Anesthesia Preprocedure Evaluation (Signed)
Anesthesia Evaluation  Patient identified by MRN, date of birth, ID band Patient awake    Reviewed: Allergy & Precautions, H&P , NPO status , Patient's Chart, lab work & pertinent test results, reviewed documented beta blocker date and time   Airway Mallampati: II  TM Distance: >3 FB Neck ROM: full    Dental no notable dental hx.    Pulmonary neg pulmonary ROS   Pulmonary exam normal breath sounds clear to auscultation       Cardiovascular Exercise Tolerance: Good hypertension, negative cardio ROS  Rhythm:regular Rate:Normal     Neuro/Psych negative neurological ROS  negative psych ROS   GI/Hepatic negative GI ROS, Neg liver ROS,,,  Endo/Other  negative endocrine ROS    Renal/GU negative Renal ROS  negative genitourinary   Musculoskeletal   Abdominal   Peds  Hematology negative hematology ROS (+) Blood dyscrasia, anemia   Anesthesia Other Findings   Reproductive/Obstetrics negative OB ROS                             Anesthesia Physical Anesthesia Plan  ASA: 3  Anesthesia Plan: General and General ETT   Post-op Pain Management:    Induction:   PONV Risk Score and Plan: Ondansetron  Airway Management Planned:   Additional Equipment:   Intra-op Plan:   Post-operative Plan:   Informed Consent: I have reviewed the patients History and Physical, chart, labs and discussed the procedure including the risks, benefits and alternatives for the proposed anesthesia with the patient or authorized representative who has indicated his/her understanding and acceptance.     Dental Advisory Given  Plan Discussed with: CRNA  Anesthesia Plan Comments:        Anesthesia Quick Evaluation

## 2023-03-01 DIAGNOSIS — K4090 Unilateral inguinal hernia, without obstruction or gangrene, not specified as recurrent: Secondary | ICD-10-CM | POA: Diagnosis not present

## 2023-03-01 MED ORDER — OXYCODONE HCL 5 MG PO TABS: 5.0000 mg | ORAL_TABLET | ORAL | 0 refills | Status: DC | PRN

## 2023-03-01 MED ORDER — ENOXAPARIN SODIUM 40 MG/0.4ML IJ SOSY: 40.0000 mg | PREFILLED_SYRINGE | INTRAMUSCULAR | 0 refills | Status: AC

## 2023-03-01 MED ORDER — ONDANSETRON 4 MG PO TBDP: 4.0000 mg | ORAL_TABLET | Freq: Four times a day (QID) | ORAL | 0 refills | Status: DC | PRN

## 2023-03-01 MED ORDER — DOCUSATE SODIUM 100 MG PO CAPS: 100.0000 mg | ORAL_CAPSULE | Freq: Two times a day (BID) | ORAL | 0 refills | Status: DC | PRN

## 2023-03-01 NOTE — Discharge Summary (Signed)
Physician Discharge Summary  Patient ID: FREMAN LEONOR MRN: [id number] DOB/AGE: September 07, 1931 89 y.o.  Admit date: 02/28/2023 Discharge date: 03/01/2023  Admission Diagnoses: Right inguinal hernia   Discharge Diagnoses:  Principal Problem:   Inguinal hernia of right side without obstruction or gangrene Active Problems:   Right inguinal hernia   Discharged Condition: good  Hospital Course: Mr. Meece is a very sweet 89 yo who came in for a right inguinal hernia repair with mesh. He did well and was having adequate pain control with scheduled tylenol and was tolerating a diet before going home. He had been bridged with therapeutic lovenox while off his warfarin due to his recurrent DVTs, and for going home he received lovenox 40mg  today and will start back his coumadin when he gets home. He was sent home with plans for prophylactic dose of lovneox 40mg  each day and to start back his coumadin 5mg  with plans for recheck with his PCP Friday. I did not want to do any therapeutic dosing or having him increase his coumadin due to risk of bleeding but will bridge with the prophylactic until he gets back to his steady state. He knows he needs to call and make this appt with them.    Consults: None  Significant Diagnostic Studies: None   Treatments: IV hydration and Right open inguinal hernia repair with mesh   Discharge Exam: Blood pressure 100/63, pulse 62, temperature 98.4 F (36.9 C), temperature source Oral, resp. rate 20, height 5\' 11"  (1.803 m), weight 77.8 kg, SpO2 100 %. General appearance: alert and no distress Resp: normal work of breathing GI: soft, nondistended, right inguinal region appropriately tender, incision c/d/I with dermabond, some swelling, no bruising noted  Disposition: Discharge disposition: 01-Home or Self Care       Discharge Instructions     Call MD for:  difficulty breathing, headache or visual disturbances   Complete by: As directed    Call MD for:   extreme fatigue   Complete by: As directed    Call MD for:  persistant dizziness or light-headedness   Complete by: As directed    Call MD for:  persistant nausea and vomiting   Complete by: As directed    Call MD for:  redness, tenderness, or signs of infection (pain, swelling, redness, odor or green/yellow discharge around incision site)   Complete by: As directed    Call MD for:  severe uncontrolled pain   Complete by: As directed    Call MD for:  temperature >100.4   Complete by: As directed    Increase activity slowly   Complete by: As directed       Allergies as of 03/01/2023   No Known Allergies      Medication List     STOP taking these medications    enoxaparin 120 MG/0.8ML injection Commonly known as: LOVENOX Replaced by: enoxaparin 40 MG/0.4ML injection       TAKE these medications    albuterol 108 (90 Base) MCG/ACT inhaler Commonly known as: VENTOLIN HFA Inhale 1-2 puffs into the lungs every 4 (four) hours as needed for wheezing or shortness of breath.   B-12 2500 MCG Tabs Take 2,500 mcg by mouth daily.   docusate sodium 100 MG capsule Commonly known as: COLACE Take 1 capsule (100 mg total) by mouth 2 (two) times daily as needed for mild constipation.   dorzolamide 2 % ophthalmic solution Commonly known as: TRUSOPT Place 1 drop into both eyes 2 (two) times daily.  doxazosin 8 MG tablet Commonly known as: CARDURA Take 8 mg by mouth daily.   enoxaparin 40 MG/0.4ML injection Commonly known as: LOVENOX Inject 0.4 mLs (40 mg total) into the skin daily for 3 days. Take lovenox prophylactic dose starting Wednesday 5/8 with your normal coumadin tablet of 5mg . Do this daily for three days and get your coumadin level checked by PCP on Friday 5/10. Start taking on: Mar 02, 2023 Replaces: enoxaparin 120 MG/0.8ML injection   Glucosamine Sulfate 1000 MG Caps Take 1,000 mg by mouth daily.   hydrochlorothiazide 50 MG tablet Commonly known as:  HYDRODIURIL Take 50 mg by mouth daily.   IRON PO Take 45 mg by mouth daily.   latanoprost 0.005 % ophthalmic solution Commonly known as: XALATAN Place 1 drop into both eyes at bedtime.   loratadine 10 MG tablet Commonly known as: CLARITIN Take 10 mg by mouth daily.   ondansetron 4 MG disintegrating tablet Commonly known as: ZOFRAN-ODT Take 1 tablet (4 mg total) by mouth every 6 (six) hours as needed for nausea.   oxyCODONE 5 MG immediate release tablet Commonly known as: Oxy IR/ROXICODONE Take 1 tablet (5 mg total) by mouth every 4 (four) hours as needed for severe pain or breakthrough pain.   sodium bicarbonate 650 MG tablet Take 650 mg by mouth daily.   Vitamin D3 50 MCG (2000 UT) Tabs Take 2,000 Units by mouth daily.   warfarin 5 MG tablet Commonly known as: COUMADIN Take 5 mg by mouth daily.        Follow-up Information     Lucretia Roers, MD Follow up on 03/29/2023.   Specialty: General Surgery Why: hernia check Contact information: [address] [facility] [address] [phone]         Toma Deiters, MD Follow up on 03/04/2023.   Specialty: Internal Medicine Why: Coumadin check, call them and make this appt Contact information: [address] [place] [address] [phone]                 Signed: Lucretia Roers 03/01/2023, 11:00 AM

## 2023-03-01 NOTE — Discharge Instructions (Signed)
Discharge Instructions Hernia:  Take lovenox 40 mg prophylactic shot on Wednesday 5/8 and your coumadin 5mg  tablet. Take this on 5/9 and 5/10. Get your INR checked at PCP office on 5/10.   Common Complaints: Pain at the incision site is common. This will improve with time. Take your pain medications as described below. Some nausea is common and poor appetite. The main goal is to stay hydrated the first few days after surgery.  Numbness at the incision or the thigh is common.  If you start to have burning or tingling pain in your groin, this is from a nerve being pinched. Please call and we can prescribe you a different type of pain medication for nerve pain.  Bruising in the side of the repair and down into the scrotum on that side is normal, if the bruising is spreading, call the office.  Some swelling is normal, place a towel under your scrotum (folded into a square) to help with elevation of the scrotum and swelling. Ice the side for the first 72 hours.   Diet/ Activity: Diet as tolerated. You may not have an appetite, but it is important to stay hydrated. Drink 64 ounces of water a day. Your appetite will return with time.  Shower per your regular routine daily.  Do not take hot showers. Take warm showers that are less than 10 minutes. Rest and listen to your body, but do not remain in bed all day.  Walk everyday for at least 15-20 minutes. Deep cough and move around every 1-2 hours in the first few days after surgery.  Do not pick at the dermabond glue on your incision sites.  This glue film will remain in place for 1-2 weeks and will start to peel off.  Do not place lotions or balms on your incision unless instructed to specifically by Dr. Henreitta Leber.  Do not lift > 10 lbs, perform excessive bending, pushing, pulling, squatting for 6-8 weeks after surgery.   Pain Expectations and Narcotics: -After surgery you will have pain associated with your incisions and this is normal. The pain is  muscular and nerve pain, and will get better with time. -You are encouraged and expected to take non narcotic medications like tylenol and ibuprofen (when able) to treat pain as multiple modalities can aid with pain treatment. -Narcotics are only used when pain is severe or there is breakthrough pain. -You are not expected to have a pain score of 0 after surgery, as we cannot prevent pain. A pain score of 3-4 that allows you to be functional, move, walk, and tolerate some activity is the goal. The pain will continue to improve over the days after surgery and is dependent on your surgery. -Due to [place] law, we are only able to give a certain amount of pain medication to treat post operative pain, and we only give additional narcotics on a patient by patient basis.  -For most laparoscopic surgery, studies have shown that the majority of patients only need 10-15 narcotic pills, and for open surgeries most patients only need 15-20.   -Having appropriate expectations of pain and knowledge of pain management with non narcotics is important as we do not want anyone to become addicted to narcotic pain medication.  -Using ice packs in the first 48 hours and heating pads after 48 hours, wearing an abdominal binder (when recommended), and using over the counter medications are all ways to help with pain management.   -Simple acts like meditation and mindfulness practices after  surgery can also help with pain control and research has proven the benefit of these practices.  Medication: Take tylenol and ibuprofen as needed for pain control, alternating every 4-6 hours.  Example:  Tylenol 1000mg  @ 6am, 12noon, 6pm, (Do not exceed 4000mg  of tylenol a day). Ibuprofen 800mg  @ 9am, 3pm, 9pm, 3am (Do not exceed 3600mg  of ibuprofen a day).  Take Roxicodone for breakthrough pain every 4 hours.  Take Colace for constipation related to narcotic pain medication. If you do not have a bowel movement in 2 days, take  Miralax over the counter.  Drink plenty of water to also prevent constipation.   Contact Information: If you have questions or concerns, please call our office, [phone], Monday- Thursday 8AM-5PM and Friday 8AM-12Noon.  If it is after hours or on the weekend, please call [facility] Main Number, [phone], [phone], and ask to speak to the surgeon on call for Dr. Henreitta Leber at [facility].

## 2023-03-01 NOTE — Anesthesia Postprocedure Evaluation (Signed)
Anesthesia Post Note  Patient: Dale Suarez  Procedure(s) Performed: HERNIA REPAIR INGUINAL ADULT W/ MESH (Right: Inguinal)  Patient location during evaluation: Phase II Anesthesia Type: General Level of consciousness: awake Pain management: pain level controlled Vital Signs Assessment: post-procedure vital signs reviewed and stable Respiratory status: spontaneous breathing and respiratory function stable Cardiovascular status: blood pressure returned to baseline and stable Postop Assessment: no headache and no apparent nausea or vomiting Anesthetic complications: no Comments: Late entry   No notable events documented.   Last Vitals:  Vitals:   03/01/23 0252 03/01/23 0438  BP: 127/69 100/63  Pulse: 67 62  Resp: 20 20  Temp: 36.7 C 36.9 C  SpO2: 98% 100%    Last Pain:  Vitals:   03/01/23 0438  TempSrc: Oral  PainSc:                  Windell Norfolk

## 2023-03-03 ENCOUNTER — Encounter (HOSPITAL_COMMUNITY): Payer: Self-pay | Admitting: General Surgery

## 2023-03-29 ENCOUNTER — Ambulatory Visit (INDEPENDENT_AMBULATORY_CARE_PROVIDER_SITE_OTHER): Payer: Medicare FFS | Admitting: General Surgery

## 2023-03-29 ENCOUNTER — Encounter: Payer: Self-pay | Admitting: General Surgery

## 2023-03-29 VITALS — BP 116/71 | HR 88 | Temp 98.2°F | Resp 14 | Ht 71.0 in | Wt 169.0 lb

## 2023-03-29 DIAGNOSIS — K4090 Unilateral inguinal hernia, without obstruction or gangrene, not specified as recurrent: Secondary | ICD-10-CM

## 2023-03-29 NOTE — Patient Instructions (Signed)
Ok to start lifting up to 20 lbs but avoid over 20 lbs for another 2 weeks. Call with questions or concerns.

## 2023-03-30 NOTE — Progress Notes (Signed)
[facility]  Patient reports he is doing well. He has had minimal soreness. He is getting around. He has been taking multiple trips into the laundry to make sure he does not lift over 10 lbs.   BP 116/71   Pulse 88   Temp 98.2 F (36.8 C) (Oral)   Resp 14   Ht 5\' 11"  (1.803 m)   Wt 169 lb (76.7 kg)   SpO2 97%   BMI 23.57 kg/m  Right inguinal incision c/d/I without erythema or drainage, no signs of recurrence, minor induration at scar   Patient s/p R open inguinal hernia repair with mesh.   Ok to start lifting up to 20 lbs but avoid over 20 lbs for another 2 weeks. Call with questions or concerns.   Algis Greenhouse, MD [facility] [address] [place], [place] [address] [phone] (office)

## 2023-03-31 ENCOUNTER — Other Ambulatory Visit: Payer: Self-pay | Admitting: General Surgery

## 2023-08-10 ENCOUNTER — Inpatient Hospital Stay: Payer: Medicare FFS | Attending: Hematology

## 2023-08-10 ENCOUNTER — Other Ambulatory Visit: Payer: Medicare FFS

## 2023-08-10 DIAGNOSIS — D473 Essential (hemorrhagic) thrombocythemia: Secondary | ICD-10-CM | POA: Diagnosis present

## 2023-08-10 DIAGNOSIS — Z7901 Long term (current) use of anticoagulants: Secondary | ICD-10-CM | POA: Diagnosis not present

## 2023-08-10 DIAGNOSIS — E611 Iron deficiency: Secondary | ICD-10-CM | POA: Diagnosis not present

## 2023-08-10 DIAGNOSIS — Z86718 Personal history of other venous thrombosis and embolism: Secondary | ICD-10-CM | POA: Insufficient documentation

## 2023-08-10 DIAGNOSIS — Z86711 Personal history of pulmonary embolism: Secondary | ICD-10-CM | POA: Diagnosis not present

## 2023-08-10 DIAGNOSIS — C9000 Multiple myeloma not having achieved remission: Secondary | ICD-10-CM

## 2023-08-10 LAB — COMPREHENSIVE METABOLIC PANEL
ALT: 12 U/L (ref 0–44)
AST: 24 U/L (ref 15–41)
Albumin: 3.4 g/dL — ABNORMAL LOW (ref 3.5–5.0)
Alkaline Phosphatase: 40 U/L (ref 38–126)
Anion gap: 4 — ABNORMAL LOW (ref 5–15)
BUN: 24 mg/dL — ABNORMAL HIGH (ref 8–23)
CO2: 24 mmol/L (ref 22–32)
Calcium: 9 mg/dL (ref 8.9–10.3)
Chloride: 98 mmol/L (ref 98–111)
Creatinine, Ser: 1.5 mg/dL — ABNORMAL HIGH (ref 0.61–1.24)
GFR, Estimated: 44 mL/min — ABNORMAL LOW (ref >60)
Glucose, Bld: 87 mg/dL (ref 70–99)
Potassium: 3.7 mmol/L (ref 3.5–5.1)
Sodium: 126 mmol/L — ABNORMAL LOW (ref 135–145)
Total Bilirubin: 0.8 mg/dL (ref 0.3–1.2)
Total Protein: 11.4 g/dL — ABNORMAL HIGH (ref 6.5–8.1)

## 2023-08-10 LAB — CBC WITH DIFFERENTIAL/PLATELET
Abs Immature Granulocytes: 0 10*3/uL (ref 0.00–0.07)
Basophils Absolute: 0 10*3/uL (ref 0.0–0.1)
Basophils Relative: 1 %
Eosinophils Absolute: 0 10*3/uL (ref 0.0–0.5)
Eosinophils Relative: 1 %
HCT: 33 % — ABNORMAL LOW (ref 39.0–52.0)
Hemoglobin: 10.7 g/dL — ABNORMAL LOW (ref 13.0–17.0)
Immature Granulocytes: 0 %
Lymphocytes Relative: 28 %
Lymphs Abs: 0.9 10*3/uL (ref 0.7–4.0)
MCH: 32.9 pg (ref 26.0–34.0)
MCHC: 32.4 g/dL (ref 30.0–36.0)
MCV: 101.5 fL — ABNORMAL HIGH (ref 80.0–100.0)
Monocytes Absolute: 0.5 10*3/uL (ref 0.1–1.0)
Monocytes Relative: 15 %
Neutro Abs: 1.8 10*3/uL (ref 1.7–7.7)
Neutrophils Relative %: 55 %
Platelets: 158 10*3/uL (ref 150–400)
RBC: 3.25 MIL/uL — ABNORMAL LOW (ref 4.22–5.81)
RDW: 14.9 % (ref 11.5–15.5)
WBC: 3.2 10*3/uL — ABNORMAL LOW (ref 4.0–10.5)
nRBC: 0 % (ref 0.0–0.2)

## 2023-08-10 LAB — IRON AND TIBC
Iron: 63 ug/dL (ref 45–182)
Saturation Ratios: 22 % (ref 17.9–39.5)
TIBC: 288 ug/dL (ref 250–450)
UIBC: 225 ug/dL

## 2023-08-10 LAB — FERRITIN: Ferritin: 39 ng/mL (ref 24–336)

## 2023-08-11 LAB — KAPPA/LAMBDA LIGHT CHAINS
Kappa free light chain: 402.2 mg/L — ABNORMAL HIGH (ref 3.3–19.4)
Kappa, lambda light chain ratio: 20.11 — ABNORMAL HIGH (ref 0.26–1.65)
Lambda free light chains: 20 mg/L (ref 5.7–26.3)

## 2023-08-14 LAB — PROTEIN ELECTROPHORESIS, SERUM
A/G Ratio: 0.6 — ABNORMAL LOW (ref 0.7–1.7)
Albumin ELP: 4 g/dL (ref 2.9–4.4)
Alpha-1-Globulin: 0.3 g/dL (ref 0.0–0.4)
Alpha-2-Globulin: 0.7 g/dL (ref 0.4–1.0)
Beta Globulin: 0.9 g/dL (ref 0.7–1.3)
Gamma Globulin: 5.3 g/dL — ABNORMAL HIGH (ref 0.4–1.8)
Globulin, Total: 7.2 g/dL — ABNORMAL HIGH (ref 2.2–3.9)
M-Spike, %: 4.8 g/dL — ABNORMAL HIGH
Total Protein ELP: 11.2 g/dL — ABNORMAL HIGH (ref 6.0–8.5)

## 2023-08-17 ENCOUNTER — Ambulatory Visit (HOSPITAL_COMMUNITY)
Admission: RE | Admit: 2023-08-17 | Discharge: 2023-08-17 | Disposition: A | Discharge status: Home / Self Care | Payer: Medicare FFS | Source: Ambulatory Visit | Attending: Hematology | Admitting: Hematology

## 2023-08-17 ENCOUNTER — Inpatient Hospital Stay: Payer: Medicare FFS | Admitting: Hematology

## 2023-08-17 VITALS — BP 118/76 | HR 84 | Temp 97.7°F | Resp 18 | Ht 71.0 in | Wt 166.9 lb

## 2023-08-17 DIAGNOSIS — D472 Monoclonal gammopathy: Secondary | ICD-10-CM | POA: Diagnosis not present

## 2023-08-17 DIAGNOSIS — D508 Other iron deficiency anemias: Secondary | ICD-10-CM | POA: Diagnosis not present

## 2023-08-17 DIAGNOSIS — C9000 Multiple myeloma not having achieved remission: Secondary | ICD-10-CM | POA: Diagnosis not present

## 2023-08-17 DIAGNOSIS — D473 Essential (hemorrhagic) thrombocythemia: Secondary | ICD-10-CM | POA: Diagnosis not present

## 2023-08-17 NOTE — Patient Instructions (Signed)
[facility] at [facility] Discharge Instructions   You were seen and examined today by Dr. Ellin Saba.  He reviewed the results of your lab work which are normal/stable.   There is no indication for treatment at this time. We can continue to monitor your condition with periodic lab work.   We will see you back in.  We will repeat lab work prior to this visit.   Return as scheduled.    Thank you for choosing [facility] at [facility] to provide your oncology and hematology care.  To afford each patient quality time with our provider, please arrive at least 15 minutes before your scheduled appointment time.   If you have a lab appointment with the Cancer Center please come in thru the Main Entrance and check in at the main information desk.  You need to re-schedule your appointment should you arrive 10 or more minutes late.  We strive to give you quality time with our providers, and arriving late affects you and other patients whose appointments are after yours.  Also, if you no show three or more times for appointments you may be dismissed from the clinic at the providers discretion.     Again, thank you for choosing [facility].  Our hope is that these requests will decrease the amount of time that you wait before being seen by our physicians.       _____________________________________________________________  Should you have questions after your visit to [facility], please contact our office at [phone] and follow the prompts.  Our office hours are 8:00 a.m. and 4:30 p.m. Monday - Friday.  Please note that voicemails left after 4:00 p.m. may not be returned until the following business day.  We are closed weekends and major holidays.  You do have access to a nurse 24-7, just call the main number to the clinic [phone] and do not press any options, hold on the line and a nurse will answer the phone.    For  prescription refill requests, have your pharmacy contact our office and allow 72 hours.    Due to Covid, you will need to wear a mask upon entering the hospital. If you do not have a mask, a mask will be given to you at the Main Entrance upon arrival. For doctor visits, patients may have 1 support person age 60 or older with them. For treatment visits, patients can not have anyone with them due to social distancing guidelines and our immunocompromised population.

## 2023-08-17 NOTE — Progress Notes (Signed)
[facility] [address], [place] [address]    Clinic Day:  08/17/2023  Referring physician: Toma Deiters, MD  Patient Care Team: Toma Deiters, MD as PCP - General (Internal Medicine)   ASSESSMENT & PLAN:   Assessment: 1.  Smoldering myeloma: -BMBX on 01/07/2016 at [facility] showed trilineage hematopoiesis, plasma cells 13%, FISH panel showed an additional IGH/14 q. signal.  Gain of 9 chromosome and CCN D1/11 q.  Chromosome analysis 48, XY. -PET scan on 12/24/2019 showed no lytic lesions or suspicious lesions for myeloma.   2.  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 -Doppler on 08/09/2018 showed acute thrombus in the left popliteal and femoral vein.  Coumadin was changed to Xarelto, which was later switched to Eliquis.   3.  Mediastinal adenopathy: -PET scan on 12/24/2019 did not show any mediastinal adenopathy.  However it was seen on PET scan in 2018.    Plan: 1.  Smoldering myeloma: - Reviewed myeloma labs from 08/10/2023: Creatinine stable at 1.5.  Calcium is 9 with albumin 3.4.  M spike at 4.8.  Hemoglobin 10.7.  FLC ratio is 20.11 with kappa light chains 402. - He does not have any new onset skeletal pains.  Denies any recent infections. - No significant changes in his labs.  I have recommended that he undergo skeletal survey today. - Recommend follow-up in 6 months with repeat labs.   2.  Iron deficiency state: - Ferritin is 39 and stable.  Continue iron tablet daily.  Continue B12 tablet daily.  Hemoglobin is 10.7.  He has anemia from CKD and functional iron deficiency.  If hemoglobin continues to drop, may consider parenteral iron therapy.   3.  Recurrent leg DVT: - He is continuing warfarin without any bleeding issues.    No orders of the defined types were placed in this encounter.    Doreatha Massed, MD   10/23/20243:04 PM  CHIEF COMPLAINT:   Diagnosis: smoldering myeloma     Cancer Staging  No matching staging information was found for the patient.    Prior Therapy: none  Current Therapy:  surveillance    HISTORY OF PRESENT ILLNESS:   Oncology History   No history exists.     INTERVAL HISTORY:   Dale Suarez is a 89 y.o. male seen for follow-up of smoldering myeloma.  In the preceding 6 months, he denies any infections.  Denies any falls.  No new bone pains.  Appetite is 100% and energy levels are 80%.  PAST MEDICAL HISTORY:   Past Medical History: Past Medical History:  Diagnosis Date   Anemia    Glaucoma    Hypertension    Smoldering myeloma     Surgical History: Past Surgical History:  Procedure Laterality Date   INGUINAL HERNIA REPAIR Right 02/28/2023   Procedure: HERNIA REPAIR INGUINAL ADULT W/ MESH;  Surgeon: Lucretia Roers, MD;  Location: [facility];  Service: General;  Laterality: Right;   spinal tap with sedation     Patient stated this was done during diagnosis of myeloma    Social History: Social History   Socioeconomic History   Marital status: Widowed    Spouse name: Not on file   Number of children: Not on file   Years of education: Not on file   Highest education level: Not on file  Occupational History   Not on file  Tobacco Use  Smoking status: Never   Smokeless tobacco: Never  Substance and Sexual Activity   Alcohol use: No   Drug use: No   Sexual activity: Not Currently  Other Topics Concern   Not on file  Social History Narrative   Not on file   Social Determinants of Health   Financial Resource Strain: Low Risk  (10/01/2020)   Overall Financial Resource Strain (CARDIA)    Difficulty of Paying Living Expenses: Not hard at all  Food Insecurity: No Food Insecurity (02/28/2023)   Hunger Vital Sign    Worried About Running Out of Food in the Last Year: Never true    Ran Out of Food in the Last Year: Never true  Transportation Needs: No Transportation Needs (02/28/2023)   PRAPARE - Scientist, research (physical sciences) (Medical): No    Lack of Transportation (Non-Medical): No  Physical Activity: Inactive (10/01/2020)   Exercise Vital Sign    Days of Exercise per Week: 0 days    Minutes of Exercise per Session: 0 min  Stress: Stress Concern Present (10/01/2020)   [employer] of Occupational Health - Occupational Stress Questionnaire    Feeling of Stress : To some extent  Social Connections: Moderately Isolated (10/01/2020)   Social Connection and Isolation Panel [NHANES]    Frequency of Communication with Friends and Family: More than three times a week    Frequency of Social Gatherings with Friends and Family: Never    Attends Religious Services: More than 4 times per year    Active Member of [employer] or Organizations: No    Attends Banker Meetings: Never    Marital Status: Widowed  Intimate Partner Violence: Not At Risk (02/28/2023)   Humiliation, Afraid, Rape, and Kick questionnaire    Fear of Current or Ex-Partner: No    Emotionally Abused: No    Physically Abused: No    Sexually Abused: No    Family History: Family History  Problem Relation Age of Onset   Hypertension Mother    Heart attack Mother    Heart attack Father    Stroke Sister    Alcoholism Brother    Cirrhosis Brother     Current Medications:  Current Outpatient Medications:    albuterol (PROVENTIL HFA;VENTOLIN HFA) 108 (90 Base) MCG/ACT inhaler, Inhale 1-2 puffs into the lungs every 4 (four) hours as needed for wheezing or shortness of breath., Disp: , Rfl:    Cholecalciferol (VITAMIN D3) 2000 units TABS, Take 2,000 Units by mouth daily. , Disp: , Rfl:    Cyanocobalamin (B-12) 2500 MCG TABS, Take 2,500 mcg by mouth daily., Disp: , Rfl:    docusate sodium (COLACE) 100 MG capsule, Take 1 capsule (100 mg total) by mouth 2 (two) times daily as needed for mild constipation., Disp: 30 capsule, Rfl: 0   dorzolamide (TRUSOPT) 2 % ophthalmic solution, Place 1 drop into both eyes 2 (two) times daily.,  Disp: , Rfl: 2   doxazosin (CARDURA) 8 MG tablet, Take 8 mg by mouth daily. , Disp: , Rfl:    enoxaparin (LOVENOX) 40 MG/0.4ML injection, Inject 0.4 mLs (40 mg total) into the skin daily for 3 days. Take lovenox prophylactic dose starting Wednesday 5/8 with your normal coumadin tablet of 5mg . Do this daily for three days and get your coumadin level checked by PCP on Friday 5/10., Disp: 1.2 mL, Rfl: 0   Ferrous Sulfate (IRON PO), Take 45 mg by mouth daily., Disp: , Rfl:    Glucosamine Sulfate  1000 MG CAPS, Take 1,000 mg by mouth daily., Disp: , Rfl:    hydrochlorothiazide (HYDRODIURIL) 50 MG tablet, Take 50 mg by mouth daily. , Disp: , Rfl:    latanoprost (XALATAN) 0.005 % ophthalmic solution, Place 1 drop into both eyes at bedtime., Disp: , Rfl:    loratadine (CLARITIN) 10 MG tablet, Take 10 mg by mouth daily. , Disp: , Rfl:    sodium bicarbonate 650 MG tablet, Take 650 mg by mouth daily., Disp: , Rfl:    warfarin (COUMADIN) 5 MG tablet, Take 5 mg by mouth daily., Disp: , Rfl:    Allergies: No Known Allergies  REVIEW OF SYSTEMS:   Review of Systems  Constitutional:  Negative for chills, fatigue and fever.  HENT:   Negative for lump/mass, mouth sores, nosebleeds, sore throat and trouble swallowing.   Eyes:  Negative for eye problems.  Respiratory:  Positive for shortness of breath. Negative for cough.   Cardiovascular:  Negative for chest pain, leg swelling and palpitations.  Gastrointestinal:  Negative for abdominal pain, constipation, diarrhea, nausea and vomiting.  Genitourinary:  Negative for bladder incontinence, difficulty urinating, dysuria, frequency, hematuria and nocturia.   Musculoskeletal:  Negative for arthralgias, back pain, flank pain, myalgias and neck pain.  Skin:  Negative for itching and rash.  Neurological:  Positive for dizziness. Negative for headaches and numbness.  Hematological:  Does not bruise/bleed easily.  Psychiatric/Behavioral:  Positive for depression.  Negative for sleep disturbance and suicidal ideas. The patient is not nervous/anxious.   All other systems reviewed and are negative.    VITALS:   There were no vitals taken for this visit.  Wt Readings from Last 3 Encounters:  03/29/23 169 lb (76.7 kg)  02/28/23 171 lb 8.3 oz (77.8 kg)  02/24/23 171 lb 9.6 oz (77.8 kg)    There is no height or weight on file to calculate BMI.  Performance status (ECOG): 1 - Symptomatic but completely ambulatory  PHYSICAL EXAM:   Physical Exam Vitals and nursing note reviewed. Exam conducted with a chaperone present.  Constitutional:      Appearance: Normal appearance.  Cardiovascular:     Rate and Rhythm: Normal rate and regular rhythm.     Pulses: Normal pulses.     Heart sounds: Normal heart sounds.  Pulmonary:     Effort: Pulmonary effort is normal.     Breath sounds: Normal breath sounds.  Abdominal:     Palpations: Abdomen is soft. There is no hepatomegaly, splenomegaly or mass.     Tenderness: There is no abdominal tenderness.  Musculoskeletal:     Right lower leg: No edema.     Left lower leg: No edema.  Lymphadenopathy:     Cervical: No cervical adenopathy.     Right cervical: No superficial, deep or posterior cervical adenopathy.    Left cervical: No superficial, deep or posterior cervical adenopathy.     Upper Body:     Right upper body: No supraclavicular or axillary adenopathy.     Left upper body: No supraclavicular or axillary adenopathy.  Neurological:     General: No focal deficit present.     Mental Status: He is alert and oriented to person, place, and time.  Psychiatric:        Mood and Affect: Mood normal.        Behavior: Behavior normal.     LABS:      Latest Ref Rng & Units 08/10/2023    2:31 PM 02/24/2023  11:47 AM 02/09/2023    1:25 PM  CBC  WBC 4.0 - 10.5 K/uL 3.2  2.4  3.5   Hemoglobin 13.0 - 17.0 g/dL 52.8  41.3  24.4   Hematocrit 39.0 - 52.0 % 33.0  34.8  34.6   Platelets 150 - 400 K/uL 158   153  171       Latest Ref Rng & Units 08/10/2023    2:31 PM 02/24/2023   11:47 AM 02/09/2023    1:25 PM  CMP  Glucose 70 - 99 mg/dL 87  79  87   BUN 8 - 23 mg/dL 24  24  20    Creatinine 0.61 - 1.24 mg/dL 0.10  2.72  5.36   Sodium 135 - 145 mmol/L 126  128  127   Potassium 3.5 - 5.1 mmol/L 3.7  3.6  3.9   Chloride 98 - 111 mmol/L 98  101  100   CO2 22 - 32 mmol/L 24  23  23    Calcium 8.9 - 10.3 mg/dL 9.0  8.8  8.9   Total Protein 6.5 - 8.1 g/dL 64.4   03.4   Total Bilirubin 0.3 - 1.2 mg/dL 0.8   0.6   Alkaline Phos 38 - 126 U/L 40   39   AST 15 - 41 U/L 24   23   ALT 0 - 44 U/L 12   12      No results found for: "CEA1", "CEA" / No results found for: "CEA1", "CEA" No results found for: "PSA1" No results found for: "CAN199" No results found for: "CAN125"  Lab Results  Component Value Date   TOTALPROTELP 11.2 (H) 08/10/2023   ALBUMINELP 4.0 08/10/2023   A1GS 0.3 08/10/2023   A2GS 0.7 08/10/2023   BETS 0.9 08/10/2023   GAMS 5.3 (H) 08/10/2023   MSPIKE 4.8 (H) 08/10/2023   SPEI Comment 08/10/2023   Lab Results  Component Value Date   TIBC 288 08/10/2023   TIBC 277 02/09/2023   TIBC 301 08/03/2022   FERRITIN 39 08/10/2023   FERRITIN 39 02/09/2023   FERRITIN 36 08/03/2022   IRONPCTSAT 22 08/10/2023   IRONPCTSAT 13 (L) 02/09/2023   IRONPCTSAT 15 (L) 08/03/2022   Lab Results  Component Value Date   LDH 158 02/09/2023   LDH 170 08/03/2022   LDH 164 09/24/2020     STUDIES:   No results found.

## 2023-09-28 NOTE — Progress Notes (Signed)
Dr. Ellin Saba made aware of results and states he will follow up with him in April as planned.  No new orders.

## 2024-02-08 ENCOUNTER — Inpatient Hospital Stay: Payer: Medicare FFS

## 2024-02-15 ENCOUNTER — Inpatient Hospital Stay: Payer: Medicare FFS | Admitting: Hematology

## 2024-04-24 DEATH — deceased

## 2024-05-25 DEATH — deceased
